# Patient Record
Sex: Female | Born: 1995 | Race: White | Hispanic: No | Marital: Single | Smoking: Never smoker
Health system: Southern US, Community
[De-identification: ages and names within clinical notes are randomized; demographics above are authoritative.]

## PROBLEM LIST (undated history)

## (undated) ENCOUNTER — Inpatient Hospital Stay (HOSPITAL_COMMUNITY): Payer: Self-pay

## (undated) DIAGNOSIS — F909 Attention-deficit hyperactivity disorder, unspecified type: Secondary | ICD-10-CM

## (undated) DIAGNOSIS — O09299 Supervision of pregnancy with other poor reproductive or obstetric history, unspecified trimester: Secondary | ICD-10-CM

## (undated) DIAGNOSIS — K219 Gastro-esophageal reflux disease without esophagitis: Secondary | ICD-10-CM

## (undated) DIAGNOSIS — F913 Oppositional defiant disorder: Secondary | ICD-10-CM

## (undated) DIAGNOSIS — F32A Depression, unspecified: Secondary | ICD-10-CM

## (undated) DIAGNOSIS — F319 Bipolar disorder, unspecified: Secondary | ICD-10-CM

## (undated) DIAGNOSIS — F419 Anxiety disorder, unspecified: Secondary | ICD-10-CM

## (undated) DIAGNOSIS — F329 Major depressive disorder, single episode, unspecified: Secondary | ICD-10-CM

## (undated) HISTORY — DX: Oppositional defiant disorder: F91.3

## (undated) HISTORY — DX: Depression, unspecified: F32.A

## (undated) HISTORY — DX: Gastro-esophageal reflux disease without esophagitis: K21.9

## (undated) HISTORY — DX: Attention-deficit hyperactivity disorder, unspecified type: F90.9

## (undated) HISTORY — PX: TONSILLECTOMY: SUR1361

## (undated) HISTORY — PX: TONSILLECTOMY AND ADENOIDECTOMY: SHX28

## (undated) HISTORY — DX: Major depressive disorder, single episode, unspecified: F32.9

---
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
## 2015-02-01 ENCOUNTER — Other Ambulatory Visit: Payer: Self-pay | Admitting: Nurse Practitioner

## 2015-05-28 ENCOUNTER — Ambulatory Visit: Payer: Medicaid Other | Admitting: Family

## 2015-05-31 ENCOUNTER — Encounter: Payer: Self-pay | Admitting: Physician Assistant

## 2015-11-14 ENCOUNTER — Inpatient Hospital Stay (HOSPITAL_COMMUNITY)
Admission: EM | Admit: 2015-11-14 | Discharge: 2015-11-16 | DRG: 086 | Disposition: A | Discharge status: Home / Self Care | Payer: Medicaid Other | Attending: General Surgery | Admitting: General Surgery

## 2015-11-14 DIAGNOSIS — R402362 Coma scale, best motor response, obeys commands, at arrival to emergency department: Secondary | ICD-10-CM | POA: Diagnosis present

## 2015-11-14 DIAGNOSIS — R402252 Coma scale, best verbal response, oriented, at arrival to emergency department: Secondary | ICD-10-CM | POA: Diagnosis present

## 2015-11-14 DIAGNOSIS — M542 Cervicalgia: Secondary | ICD-10-CM | POA: Diagnosis present

## 2015-11-14 DIAGNOSIS — S2222XA Fracture of body of sternum, initial encounter for closed fracture: Secondary | ICD-10-CM

## 2015-11-14 DIAGNOSIS — S27329A Contusion of lung, unspecified, initial encounter: Secondary | ICD-10-CM

## 2015-11-14 DIAGNOSIS — S02113A Unspecified occipital condyle fracture, initial encounter for closed fracture: Principal | ICD-10-CM | POA: Diagnosis present

## 2015-11-14 DIAGNOSIS — Z23 Encounter for immunization: Secondary | ICD-10-CM

## 2015-11-14 DIAGNOSIS — R079 Chest pain, unspecified: Secondary | ICD-10-CM | POA: Diagnosis present

## 2015-11-14 DIAGNOSIS — R110 Nausea: Secondary | ICD-10-CM | POA: Diagnosis present

## 2015-11-14 DIAGNOSIS — R402142 Coma scale, eyes open, spontaneous, at arrival to emergency department: Secondary | ICD-10-CM | POA: Diagnosis present

## 2015-11-14 DIAGNOSIS — F419 Anxiety disorder, unspecified: Secondary | ICD-10-CM | POA: Diagnosis present

## 2015-11-14 DIAGNOSIS — S2221XA Fracture of manubrium, initial encounter for closed fracture: Secondary | ICD-10-CM | POA: Diagnosis present

## 2015-11-14 HISTORY — DX: Anxiety disorder, unspecified: F41.9

## 2015-11-14 HISTORY — DX: Bipolar disorder, unspecified: F31.9

## 2015-11-14 MED ORDER — SODIUM CHLORIDE 0.9 % IV BOLUS (SEPSIS)
1000.0000 mL | Freq: Once | INTRAVENOUS | Status: AC
  Administered 2015-11-15: 1000 mL via INTRAVENOUS

## 2015-11-14 MED ORDER — TETANUS-DIPHTH-ACELL PERTUSSIS 5-2.5-18.5 LF-MCG/0.5 IM SUSP
0.5000 mL | Freq: Once | INTRAMUSCULAR | Status: AC
  Administered 2015-11-15: 0.5 mL via INTRAMUSCULAR
  Filled 2015-11-14: qty 0.5
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
## 2015-11-15 ENCOUNTER — Emergency Department (HOSPITAL_COMMUNITY): Payer: Medicaid Other

## 2015-11-15 ENCOUNTER — Encounter (HOSPITAL_COMMUNITY): Payer: Self-pay

## 2015-11-15 DIAGNOSIS — R402142 Coma scale, eyes open, spontaneous, at arrival to emergency department: Secondary | ICD-10-CM | POA: Diagnosis present

## 2015-11-15 DIAGNOSIS — S2221XA Fracture of manubrium, initial encounter for closed fracture: Secondary | ICD-10-CM | POA: Diagnosis present

## 2015-11-15 DIAGNOSIS — F419 Anxiety disorder, unspecified: Secondary | ICD-10-CM | POA: Diagnosis present

## 2015-11-15 DIAGNOSIS — R079 Chest pain, unspecified: Secondary | ICD-10-CM | POA: Diagnosis present

## 2015-11-15 DIAGNOSIS — Z23 Encounter for immunization: Secondary | ICD-10-CM | POA: Diagnosis not present

## 2015-11-15 DIAGNOSIS — M542 Cervicalgia: Secondary | ICD-10-CM | POA: Diagnosis present

## 2015-11-15 DIAGNOSIS — S02113A Unspecified occipital condyle fracture, initial encounter for closed fracture: Secondary | ICD-10-CM | POA: Diagnosis present

## 2015-11-15 DIAGNOSIS — R110 Nausea: Secondary | ICD-10-CM | POA: Diagnosis present

## 2015-11-15 DIAGNOSIS — R402362 Coma scale, best motor response, obeys commands, at arrival to emergency department: Secondary | ICD-10-CM | POA: Diagnosis present

## 2015-11-15 DIAGNOSIS — R402252 Coma scale, best verbal response, oriented, at arrival to emergency department: Secondary | ICD-10-CM | POA: Diagnosis present

## 2015-11-15 LAB — CBC
HCT: 41.4 % (ref 36.0–46.0)
HEMOGLOBIN: 14 g/dL (ref 12.0–15.0)
MCH: 31.9 pg (ref 26.0–34.0)
MCHC: 33.8 g/dL (ref 30.0–36.0)
MCV: 94.3 fL (ref 78.0–100.0)
Platelets: 330 10*3/uL (ref 150–400)
RBC: 4.39 MIL/uL (ref 3.87–5.11)
RDW: 12 % (ref 11.5–15.5)
WBC: 12.7 10*3/uL — ABNORMAL HIGH (ref 4.0–10.5)

## 2015-11-15 LAB — PROTIME-INR
INR: 1.01
Prothrombin Time: 13.3 seconds (ref 11.4–15.2)

## 2015-11-15 LAB — COMPREHENSIVE METABOLIC PANEL
ALBUMIN: 4.7 g/dL (ref 3.5–5.0)
ALT: 15 U/L (ref 14–54)
ANION GAP: 8 (ref 5–15)
AST: 21 U/L (ref 15–41)
Alkaline Phosphatase: 72 U/L (ref 38–126)
BUN: 12 mg/dL (ref 6–20)
CO2: 25 mmol/L (ref 22–32)
Calcium: 9.3 mg/dL (ref 8.9–10.3)
Chloride: 107 mmol/L (ref 101–111)
Creatinine, Ser: 0.61 mg/dL (ref 0.44–1.00)
GFR calc non Af Amer: >60 mL/min (ref >60)
GLUCOSE: 110 mg/dL — AB (ref 65–99)
Potassium: 3.7 mmol/L (ref 3.5–5.1)
SODIUM: 140 mmol/L (ref 135–145)
Total Bilirubin: 0.5 mg/dL (ref 0.3–1.2)
Total Protein: 7.3 g/dL (ref 6.5–8.1)

## 2015-11-15 LAB — RAPID URINE DRUG SCREEN, HOSP PERFORMED
Amphetamines: NOT DETECTED
BARBITURATES: NOT DETECTED
BENZODIAZEPINES: POSITIVE — AB
Cocaine: NOT DETECTED
Opiates: NOT DETECTED
Tetrahydrocannabinol: POSITIVE — AB

## 2015-11-15 LAB — URINALYSIS, ROUTINE W REFLEX MICROSCOPIC
Bilirubin Urine: NEGATIVE
GLUCOSE, UA: NEGATIVE mg/dL
Hgb urine dipstick: NEGATIVE
Ketones, ur: NEGATIVE mg/dL
Nitrite: NEGATIVE
PH: 7.5 (ref 5.0–8.0)
PROTEIN: NEGATIVE mg/dL
SPECIFIC GRAVITY, URINE: 1.014 (ref 1.005–1.030)

## 2015-11-15 LAB — I-STAT CHEM 8, ED
BUN: 12 mg/dL (ref 6–20)
CALCIUM ION: 1.18 mmol/L (ref 1.15–1.40)
Chloride: 102 mmol/L (ref 101–111)
Creatinine, Ser: 0.6 mg/dL (ref 0.44–1.00)
GLUCOSE: 105 mg/dL — AB (ref 65–99)
HCT: 41 % (ref 36.0–46.0)
HEMOGLOBIN: 13.9 g/dL (ref 12.0–15.0)
POTASSIUM: 3.6 mmol/L (ref 3.5–5.1)
SODIUM: 141 mmol/L (ref 135–145)
TCO2: 25 mmol/L (ref 0–100)

## 2015-11-15 LAB — POC URINE PREG, ED: PREG TEST UR: NEGATIVE

## 2015-11-15 LAB — URINE MICROSCOPIC-ADD ON

## 2015-11-15 LAB — ETHANOL: Alcohol, Ethyl (B): <5 mg/dL (ref <5)

## 2015-11-15 LAB — I-STAT BETA HCG BLOOD, ED (MC, WL, AP ONLY)

## 2015-11-15 LAB — SAMPLE TO BLOOD BANK

## 2015-11-15 LAB — I-STAT CG4 LACTIC ACID, ED: Lactic Acid, Venous: 1.74 mmol/L (ref 0.5–1.9)

## 2015-11-15 MED ORDER — IOPAMIDOL (ISOVUE-300) INJECTION 61%
INTRAVENOUS | Status: AC
  Administered 2015-11-15: 100 mL
  Filled 2015-11-15: qty 100

## 2015-11-15 MED ORDER — FENTANYL CITRATE (PF) 100 MCG/2ML IJ SOLN
100.0000 ug | INTRAMUSCULAR | Status: DC | PRN
  Administered 2015-11-15: 100 ug via INTRAVENOUS
  Filled 2015-11-15: qty 2

## 2015-11-15 MED ORDER — ONDANSETRON HCL 4 MG PO TABS
4.0000 mg | ORAL_TABLET | Freq: Four times a day (QID) | ORAL | Status: DC | PRN
  Administered 2015-11-16: 4 mg via ORAL
  Filled 2015-11-15: qty 1

## 2015-11-15 MED ORDER — MORPHINE SULFATE (PF) 2 MG/ML IV SOLN
INTRAVENOUS | Status: AC
  Filled 2015-11-15: qty 1

## 2015-11-15 MED ORDER — PROMETHAZINE HCL 25 MG/ML IJ SOLN
12.5000 mg | Freq: Four times a day (QID) | INTRAMUSCULAR | Status: DC | PRN
  Administered 2015-11-15 – 2015-11-16 (×2): 12.5 mg via INTRAVENOUS
  Filled 2015-11-15 (×2): qty 1

## 2015-11-15 MED ORDER — FENTANYL CITRATE (PF) 100 MCG/2ML IJ SOLN
50.0000 ug | Freq: Once | INTRAMUSCULAR | Status: AC
  Administered 2015-11-15: 50 ug via INTRAVENOUS
  Filled 2015-11-15: qty 2

## 2015-11-15 MED ORDER — NAPROXEN 250 MG PO TABS
500.0000 mg | ORAL_TABLET | Freq: Two times a day (BID) | ORAL | Status: DC
Start: 2015-11-15 — End: 2015-11-16
  Administered 2015-11-16 (×2): 500 mg via ORAL
  Filled 2015-11-15 (×2): qty 2

## 2015-11-15 MED ORDER — POTASSIUM CHLORIDE IN NACL 20-0.9 MEQ/L-% IV SOLN
INTRAVENOUS | Status: DC
  Administered 2015-11-15 – 2015-11-16 (×2): via INTRAVENOUS
  Filled 2015-11-15 (×2): qty 1000

## 2015-11-15 MED ORDER — ONDANSETRON HCL 4 MG/2ML IJ SOLN
4.0000 mg | Freq: Once | INTRAMUSCULAR | Status: AC
  Administered 2015-11-15: 4 mg via INTRAVENOUS

## 2015-11-15 MED ORDER — ACETAMINOPHEN 325 MG PO TABS: 650.0000 mg | ORAL_TABLET | ORAL | Status: DC | PRN

## 2015-11-15 MED ORDER — DOCUSATE SODIUM 100 MG PO CAPS
100.0000 mg | ORAL_CAPSULE | Freq: Two times a day (BID) | ORAL | Status: DC
  Administered 2015-11-16: 100 mg via ORAL
  Filled 2015-11-15 (×2): qty 1

## 2015-11-15 MED ORDER — OXYCODONE HCL 5 MG PO TABS: 10.0000 mg | ORAL_TABLET | ORAL | Status: DC | PRN

## 2015-11-15 MED ORDER — OXYCODONE HCL 5 MG PO TABS: 5.0000 mg | ORAL_TABLET | ORAL | Status: DC | PRN

## 2015-11-15 MED ORDER — SODIUM CHLORIDE 0.9 % IV BOLUS (SEPSIS)
1000.0000 mL | Freq: Once | INTRAVENOUS | Status: AC
  Administered 2015-11-15: 1000 mL via INTRAVENOUS

## 2015-11-15 MED ORDER — MORPHINE SULFATE (PF) 2 MG/ML IV SOLN
1.0000 mg | INTRAVENOUS | Status: DC | PRN
  Administered 2015-11-15 (×2): 2 mg via INTRAVENOUS
  Filled 2015-11-15: qty 1

## 2015-11-15 MED ORDER — DIPHENHYDRAMINE HCL 50 MG/ML IJ SOLN: 12.5000 mg | Freq: Three times a day (TID) | INTRAMUSCULAR | Status: DC | PRN

## 2015-11-15 MED ORDER — TRAMADOL HCL 50 MG PO TABS
100.0000 mg | ORAL_TABLET | Freq: Four times a day (QID) | ORAL | Status: DC
  Administered 2015-11-16: 100 mg via ORAL
  Filled 2015-11-15: qty 2

## 2015-11-15 MED ORDER — HYDROCODONE-ACETAMINOPHEN 10-325 MG PO TABS
0.5000 | ORAL_TABLET | ORAL | Status: DC | PRN
  Administered 2015-11-15: 1 via ORAL
  Administered 2015-11-15 – 2015-11-16 (×3): 2 via ORAL
  Administered 2015-11-16 (×3): 1 via ORAL
  Filled 2015-11-15: qty 1
  Filled 2015-11-15: qty 2
  Filled 2015-11-15: qty 1
  Filled 2015-11-15: qty 2
  Filled 2015-11-15: qty 1
  Filled 2015-11-15: qty 2
  Filled 2015-11-15: qty 1

## 2015-11-15 MED ORDER — MORPHINE SULFATE (PF) 2 MG/ML IV SOLN
2.0000 mg | INTRAVENOUS | Status: DC | PRN
  Administered 2015-11-15 (×2): 2 mg via INTRAVENOUS
  Filled 2015-11-15 (×2): qty 1

## 2015-11-15 MED ORDER — POLYETHYLENE GLYCOL 3350 17 G PO PACK
17.0000 g | PACK | Freq: Every day | ORAL | Status: DC
  Administered 2015-11-16: 17 g via ORAL
  Filled 2015-11-15: qty 1

## 2015-11-15 MED ORDER — ONDANSETRON HCL 4 MG/2ML IJ SOLN
4.0000 mg | Freq: Four times a day (QID) | INTRAMUSCULAR | Status: DC | PRN
  Administered 2015-11-15 – 2015-11-16 (×3): 4 mg via INTRAVENOUS
  Filled 2015-11-15 (×3): qty 2

## 2015-11-15 MED ORDER — ONDANSETRON HCL 4 MG/2ML IJ SOLN
INTRAMUSCULAR | Status: AC
  Filled 2015-11-15: qty 2

## 2015-11-15 MED ORDER — ENOXAPARIN SODIUM 40 MG/0.4ML ~~LOC~~ SOLN: 40.0000 mg | SUBCUTANEOUS | Status: DC

## 2015-11-15 MED ORDER — OXYCODONE HCL 5 MG PO TABS
5.0000 mg | ORAL_TABLET | ORAL | Status: DC | PRN
Start: 2015-11-15 — End: 2015-11-15
  Filled 2015-11-15: qty 2

## 2015-11-15 NOTE — ED Notes (Signed)
Patient transported to CT SCAN . 

## 2015-11-15 NOTE — Progress Notes (Signed)
Patient ID: Joyce Baker, female   DOB: 1996/02/07, 20 y.o.   MRN: [id number] Having a lot of posterior skull and anterior chest pain. Hx of some side effects from PO narcotics WIll try scheduled Ultram and see if this lessens need for Norco. [name] [name], MD, MPH, FACS Trauma: [phone] General Surgery: [phone]
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
## 2015-11-15 NOTE — ED Notes (Signed)
Oral foam wet swab applied to pt.'s lips . Pt. Sleeping at this time with no distress , respirations unlabored , IV sites intact , NS  bolus infusing .
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
## 2015-11-15 NOTE — ED Triage Notes (Signed)
See trauma narrator 

## 2015-11-15 NOTE — ED Notes (Signed)
Patient transported to CT scan . 
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
## 2015-11-16 ENCOUNTER — Encounter (HOSPITAL_COMMUNITY): Payer: Self-pay | Admitting: General Practice

## 2015-11-16 LAB — BASIC METABOLIC PANEL
ANION GAP: 8 (ref 5–15)
CALCIUM: 8.7 mg/dL — AB (ref 8.9–10.3)
CO2: 18 mmol/L — AB (ref 22–32)
CREATININE: 0.49 mg/dL (ref 0.44–1.00)
Chloride: 109 mmol/L (ref 101–111)
GFR calc Af Amer: >60 mL/min (ref >60)
GLUCOSE: 114 mg/dL — AB (ref 65–99)
Potassium: 3.8 mmol/L (ref 3.5–5.1)
Sodium: 135 mmol/L (ref 135–145)

## 2015-11-16 LAB — CBC
HCT: 37 % (ref 36.0–46.0)
Hemoglobin: 12.1 g/dL (ref 12.0–15.0)
MCH: 31 pg (ref 26.0–34.0)
MCHC: 32.7 g/dL (ref 30.0–36.0)
MCV: 94.9 fL (ref 78.0–100.0)
PLATELETS: 211 10*3/uL (ref 150–400)
RBC: 3.9 MIL/uL (ref 3.87–5.11)
RDW: 12.1 % (ref 11.5–15.5)
WBC: 8.4 10*3/uL (ref 4.0–10.5)

## 2015-11-16 MED ORDER — PROMETHAZINE HCL 25 MG PO TABS
25.0000 mg | ORAL_TABLET | Freq: Four times a day (QID) | ORAL | Status: DC | PRN
  Administered 2015-11-16 (×2): 25 mg via ORAL
  Filled 2015-11-16 (×2): qty 1

## 2015-11-16 MED ORDER — NAPROXEN 500 MG PO TABS: 500.0000 mg | ORAL_TABLET | Freq: Two times a day (BID) | ORAL | 1 refills | Status: DC

## 2015-11-16 MED ORDER — HYDROCODONE-ACETAMINOPHEN 10-325 MG PO TABS: 1.0000 | ORAL_TABLET | ORAL | 0 refills | Status: DC | PRN

## 2015-11-16 MED ORDER — TRAMADOL HCL 50 MG PO TABS: 100.0000 mg | ORAL_TABLET | Freq: Four times a day (QID) | ORAL | 0 refills | Status: DC

## 2015-11-16 MED ORDER — PROMETHAZINE HCL 25 MG PO TABS: 25.0000 mg | ORAL_TABLET | Freq: Four times a day (QID) | ORAL | 0 refills | Status: DC | PRN

## 2015-11-16 MED ORDER — LORAZEPAM 1 MG PO TABS
1.0000 mg | ORAL_TABLET | Freq: Once | ORAL | Status: AC
  Administered 2015-11-16: 1 mg via ORAL
  Filled 2015-11-16: qty 1
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
## 2015-11-16 NOTE — Progress Notes (Signed)
Orthopedic Tech Progress Note Patient Details:  Joyce Baker [name][birth year] [id number]  Ortho Devices Type of Ortho Device: [place] cervical collar Ortho Device/Splint Location: at bedside Ortho Device/Splint Interventions: Casandra DoffingOrdered   [name] [name] 11/16/2015, 4:59 PM

## 2015-11-16 NOTE — Progress Notes (Signed)
Discharge home. Home discharge instruction given, no question verbalized. 

## 2015-11-16 NOTE — Progress Notes (Signed)
PT Cancellation Note  Patient Details Name: Joyce LeavensBrianna M Baker MRN: [id number] DOB: 06-22-1995   Cancelled Treatment:    Reason Eval/Treat Not Completed: Fatigue/lethargy limiting ability to participate (Pt sleeping, spoke with her mother and when patient awoke she reports she feels confident to negotiate stairs.  Will f/u if patient remains hospitalized.  )   Juniel Groene Artis DelayJ Ulrich Soules 11/16/2015, 4:40 PM Joycelyn RuaAimee Virat Prather, PTA pager [phone]
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
## 2015-11-16 NOTE — Discharge Instructions (Signed)
No driving while taking hydrocodone.  Keep collar on at all times.

## 2015-11-17 ENCOUNTER — Encounter (HOSPITAL_COMMUNITY): Payer: Self-pay | Admitting: Emergency Medicine

## 2015-11-22 ENCOUNTER — Telehealth (HOSPITAL_COMMUNITY): Payer: Self-pay

## 2015-11-22 NOTE — Telephone Encounter (Signed)
Joyce Baker called in for refill of pain meds. Said she's having severe chest pain and head pain that extends from her fracture location. I wrote for both Norco 10/325, #20 and Robaxin 500mg , 2 po q6h prn spasm, #50.

## 2015-11-23 ENCOUNTER — Ambulatory Visit: Payer: Medicaid Other | Admitting: Family Medicine

## 2015-11-24 ENCOUNTER — Encounter: Payer: Self-pay | Admitting: Family

## 2015-11-25 ENCOUNTER — Telehealth (HOSPITAL_COMMUNITY): Payer: Self-pay

## 2015-11-26 ENCOUNTER — Telehealth (HOSPITAL_COMMUNITY): Payer: Self-pay

## 2015-11-26 NOTE — Telephone Encounter (Signed)
Spoke with Dr. Lindie SpruceWyatt who authorized tramadol only at this point. Wrote for #100 and for Zofran 4mg  #30. Unable to speak to pt or mother but admin asst will communicate that if they want to discuss further Dr. Lindie SpruceWyatt would be back on Tuesday.

## 2015-11-26 NOTE — Telephone Encounter (Signed)
Matter sent to Dr. Lindie SpruceWyatt for response.

## 2015-11-26 NOTE — Telephone Encounter (Signed)
NA, unable to leave message

## 2016-02-08 ENCOUNTER — Ambulatory Visit (INDEPENDENT_AMBULATORY_CARE_PROVIDER_SITE_OTHER): Payer: Medicaid Other | Admitting: Family Medicine

## 2016-02-08 VITALS — BP 128/82 | HR 82 | Temp 98.0°F | Ht 62.5 in | Wt 179.0 lb

## 2016-02-08 DIAGNOSIS — F411 Generalized anxiety disorder: Secondary | ICD-10-CM

## 2016-02-08 MED ORDER — DULOXETINE HCL 30 MG PO CPEP: 30.0000 mg | ORAL_CAPSULE | Freq: Every day | ORAL | 0 refills | Status: DC

## 2016-02-08 MED ORDER — ESCITALOPRAM OXALATE 10 MG PO TABS: 10.0000 mg | ORAL_TABLET | Freq: Every day | ORAL | 2 refills | Status: DC

## 2016-02-08 NOTE — Addendum Note (Signed)
Addended by: Mechele ClaudeSTACKS, Marijean Montanye on: 02/08/2016 04:52 PM   Modules accepted: Orders

## 2016-02-08 NOTE — Progress Notes (Addendum)
Subjective:  Patient ID: Joyce Baker, female    DOB: [birth year]  Age: 20 y.o. MRN: [id number]  CC: Anxiety   HPI Joyce Baker presents for A long psychiatric history. There's been a suggestion of bipolar. Of note is previous diagnoses as a child of oppositional defiant unspecified mood disorder and ADHD. Currently she has been off of medication for couple of years. She has noticed her anxiety symptoms increasing. They crescendoed today. She was unable to go to work. She felt like she was shaking all over breaking out in sweats her chest felt tight. She was not short of breath she had no fever chills or sweats. GAD 7 : Generalized Anxiety Score 02/08/2016  Nervous, Anxious, on Edge 3  Control/stop worrying 3  Worry too much - different things 3  Trouble relaxing 3  Restless 3  Easily annoyed or irritable 3  Afraid - awful might happen 3  Total GAD 7 Score 21  Anxiety Difficulty Very difficult      History Joyce Baker has a past medical history of ADHD (attention deficit hyperactivity disorder); Anxiety; Bipolar disorder ([place]); Depression; Gastroesophageal reflux; and Oppositional defiant disorder.   She has a past surgical history that includes Tonsillectomy and adenoidectomy and Tonsillectomy.   Her family history includes ADD / ADHD in her sister; Alcohol abuse in her father; Bipolar disorder in her mother; Cholelithiasis in her maternal grandfather, maternal grandmother, and mother; Depression in her sister; Drug abuse in her mother; Fibromyalgia in her mother; Lupus in her mother.She reports that she has never smoked. She has never used smokeless tobacco. She reports that she drinks alcohol. She reports that she uses drugs, including Marijuana.  Current Outpatient Prescriptions on File Prior to Visit  Medication Sig Dispense Refill  . [DISCONTINUED] amphetamine-dextroamphetamine (ADDERALL XR) 20 MG 24 hr capsule Take 1 capsule (20 mg total) by mouth daily. 30 capsule 0   No  current facility-administered medications on file prior to visit.     ROS Review of Systems  Constitutional: Negative for activity change, appetite change and fever.  HENT: Negative for congestion, rhinorrhea and sore throat.   Eyes: Negative for visual disturbance.  Respiratory: Negative for cough and shortness of breath.   Cardiovascular: Negative for chest pain and palpitations.  Gastrointestinal: Negative for abdominal pain, diarrhea and nausea.  Genitourinary: Negative for dysuria.  Musculoskeletal: Negative for arthralgias and myalgias.    Objective:  BP 128/82   Pulse 82   Temp 98 F (36.7 C) (Oral)   Ht 5' 2.5" (1.588 m)   Wt 179 lb (81.2 kg)   BMI 32.22 kg/m   Physical Exam  Constitutional: She is oriented to person, place, and time. She appears well-developed and well-nourished. She appears distressed.  HENT:  Head: Normocephalic and atraumatic.  Right Ear: External ear normal.  Left Ear: External ear normal.  Nose: Nose normal.  Mouth/Throat: Oropharynx is clear and moist.  Eyes: Conjunctivae and EOM are normal. Pupils are equal, round, and reactive to light.  Neck: Normal range of motion. Neck supple. No thyromegaly present.  Cardiovascular: Normal rate, regular rhythm and normal heart sounds.   No murmur heard. Pulmonary/Chest: Effort normal and breath sounds normal. No respiratory distress. She has no wheezes. She has no rales.  Abdominal: Soft. Bowel sounds are normal. She exhibits no distension. There is no tenderness.  Lymphadenopathy:    She has no cervical adenopathy.  Neurological: She is alert and oriented to person, place, and time. She has normal reflexes.  Skin: Skin is warm and dry.  Psychiatric: Thought content normal. Her mood appears anxious. Her speech is rapid and/or pressured. She is agitated. Cognition and memory are normal. She expresses impulsivity.    Assessment & Plan:   Joyce Baker was seen today for anxiety.  Diagnoses and all orders  for this visit:  Anxiety state -     Ambulatory referral to Psychiatry -     CBC with Differential/Platelet -     CMP14+EGFR -     TSH + free T4  Other orders -     Discontinue: DULoxetine (CYMBALTA) 30 MG capsule; Take 1 capsule (30 mg total) by mouth daily. For one week then two daily. Take with a full stomach at suppertime -     escitalopram (LEXAPRO) 10 MG tablet; Take 1 tablet (10 mg total) by mouth daily.   I have discontinued Joyce Baker naproxen, cyclobenzaprine, HYDROcodone-acetaminophen, NEXIUM, DULoxetine, HYDROcodone-acetaminophen, naproxen, promethazine, and traMADol. I am also having her start on escitalopram.  Meds ordered this encounter  Medications  . DISCONTD: DULoxetine (CYMBALTA) 30 MG capsule    Sig: Take 1 capsule (30 mg total) by mouth daily. For one week then two daily. Take with a full stomach at suppertime    Dispense:  60 capsule    Refill:  0  . escitalopram (LEXAPRO) 10 MG tablet    Sig: Take 1 tablet (10 mg total) by mouth daily.    Dispense:  30 tablet    Refill:  2     Follow-up: Return in about 2 weeks (around 02/22/2016).  Claretta Fraise, M.D.

## 2016-02-09 LAB — CMP14+EGFR
A/G RATIO: 2.1 (ref 1.2–2.2)
ALT: 17 IU/L (ref 0–32)
AST: 14 IU/L (ref 0–40)
Albumin: 4.9 g/dL (ref 3.5–5.5)
Alkaline Phosphatase: 87 IU/L (ref 39–117)
BUN/Creatinine Ratio: 21 (ref 9–23)
BUN: 12 mg/dL (ref 6–20)
Bilirubin Total: 0.2 mg/dL (ref 0.0–1.2)
CALCIUM: 10 mg/dL (ref 8.7–10.2)
CO2: 23 mmol/L (ref 18–29)
CREATININE: 0.58 mg/dL (ref 0.57–1.00)
Chloride: 103 mmol/L (ref 96–106)
GFR, EST AFRICAN AMERICAN: 153 mL/min/{1.73_m2} (ref >59)
GFR, EST NON AFRICAN AMERICAN: 133 mL/min/{1.73_m2} (ref >59)
GLOBULIN, TOTAL: 2.3 g/dL (ref 1.5–4.5)
Glucose: 85 mg/dL (ref 65–99)
Potassium: 4.2 mmol/L (ref 3.5–5.2)
Sodium: 144 mmol/L (ref 134–144)
TOTAL PROTEIN: 7.2 g/dL (ref 6.0–8.5)

## 2016-02-09 LAB — CBC WITH DIFFERENTIAL/PLATELET
BASOS: 0 %
Basophils Absolute: 0 10*3/uL (ref 0.0–0.2)
EOS (ABSOLUTE): 0.2 10*3/uL (ref 0.0–0.4)
EOS: 2 %
HEMATOCRIT: 39.1 % (ref 34.0–46.6)
Hemoglobin: 13.3 g/dL (ref 11.1–15.9)
IMMATURE GRANS (ABS): 0 10*3/uL (ref 0.0–0.1)
IMMATURE GRANULOCYTES: 0 %
LYMPHS: 36 %
Lymphocytes Absolute: 3 10*3/uL (ref 0.7–3.1)
MCH: 31.8 pg (ref 26.6–33.0)
MCHC: 34 g/dL (ref 31.5–35.7)
MCV: 94 fL (ref 79–97)
MONOS ABS: 0.6 10*3/uL (ref 0.1–0.9)
Monocytes: 7 %
NEUTROS PCT: 55 %
Neutrophils Absolute: 4.6 10*3/uL (ref 1.4–7.0)
PLATELETS: 424 10*3/uL — AB (ref 150–379)
RBC: 4.18 x10E6/uL (ref 3.77–5.28)
RDW: 12.9 % (ref 12.3–15.4)
WBC: 8.4 10*3/uL (ref 3.4–10.8)

## 2016-02-09 LAB — TSH+FREE T4
FREE T4: 1.16 ng/dL (ref 0.82–1.77)
TSH: 0.906 u[IU]/mL (ref 0.450–4.500)

## 2016-04-27 ENCOUNTER — Telehealth (HOSPITAL_COMMUNITY): Payer: Self-pay | Admitting: *Deleted

## 2016-04-27 NOTE — Telephone Encounter (Signed)
[facility]. Office called on 03-01-2016 and Joyce ShiverDenise Baker  Name was on voicemail and a voicemail was lm with pt name for her to call office back. Office later called on 04-27-2016 at 10:23am and Joyce Baker picked up. Office staff asked Joyce Baker if they could speak with pt. Per Joyce Blonderenise, pt was not available and staff asked her to please have pt call office and and office number was provider. Joyce Baker verbalized understanding and agreed.

## 2016-08-09 ENCOUNTER — Other Ambulatory Visit: Payer: Self-pay

## 2016-08-09 ENCOUNTER — Encounter (HOSPITAL_COMMUNITY): Payer: Self-pay | Admitting: Nurse Practitioner

## 2016-08-09 ENCOUNTER — Emergency Department (HOSPITAL_COMMUNITY)
Admission: EM | Admit: 2016-08-09 | Discharge: 2016-08-09 | Disposition: A | Discharge status: Home / Self Care | Payer: Medicaid Other | Attending: Emergency Medicine | Admitting: Emergency Medicine

## 2016-08-09 DIAGNOSIS — Z79899 Other long term (current) drug therapy: Secondary | ICD-10-CM | POA: Insufficient documentation

## 2016-08-09 DIAGNOSIS — F902 Attention-deficit hyperactivity disorder, combined type: Secondary | ICD-10-CM | POA: Insufficient documentation

## 2016-08-09 DIAGNOSIS — F410 Panic disorder [episodic paroxysmal anxiety] without agoraphobia: Secondary | ICD-10-CM

## 2016-08-09 LAB — BASIC METABOLIC PANEL
Anion gap: 8 (ref 5–15)
BUN: 9 mg/dL (ref 6–20)
CALCIUM: 9.3 mg/dL (ref 8.9–10.3)
CHLORIDE: 104 mmol/L (ref 101–111)
CO2: 23 mmol/L (ref 22–32)
CREATININE: 0.55 mg/dL (ref 0.44–1.00)
GFR calc Af Amer: >60 mL/min (ref >60)
GFR calc non Af Amer: >60 mL/min (ref >60)
Glucose, Bld: 101 mg/dL — ABNORMAL HIGH (ref 65–99)
Potassium: 3.5 mmol/L (ref 3.5–5.1)
SODIUM: 135 mmol/L (ref 135–145)

## 2016-08-09 LAB — URINALYSIS, ROUTINE W REFLEX MICROSCOPIC
BILIRUBIN URINE: NEGATIVE
GLUCOSE, UA: NEGATIVE mg/dL
HGB URINE DIPSTICK: NEGATIVE
KETONES UR: 5 mg/dL — AB
Nitrite: NEGATIVE
PH: 5 (ref 5.0–8.0)
PROTEIN: NEGATIVE mg/dL
Specific Gravity, Urine: 1.026 (ref 1.005–1.030)

## 2016-08-09 LAB — ETHANOL

## 2016-08-09 LAB — ACETAMINOPHEN LEVEL

## 2016-08-09 LAB — RAPID URINE DRUG SCREEN, HOSP PERFORMED
Amphetamines: NOT DETECTED
BARBITURATES: NOT DETECTED
BENZODIAZEPINES: POSITIVE — AB
Cocaine: NOT DETECTED
Opiates: NOT DETECTED
Tetrahydrocannabinol: POSITIVE — AB

## 2016-08-09 LAB — POC URINE PREG, ED: PREG TEST UR: NEGATIVE

## 2016-08-09 LAB — SALICYLATE LEVEL

## 2016-08-09 MED ORDER — HYDROXYZINE HCL 25 MG PO TABS
25.0000 mg | ORAL_TABLET | Freq: Once | ORAL | Status: AC
  Administered 2016-08-09: 25 mg via ORAL
  Filled 2016-08-09: qty 1

## 2016-08-09 MED ORDER — HYDROXYZINE HCL 25 MG PO TABS: 25.0000 mg | ORAL_TABLET | Freq: Every evening | ORAL | 0 refills | Status: DC | PRN

## 2016-08-09 NOTE — BH Assessment (Signed)
Tele Assessment Note   Joyce LeavensBrianna M Baker is an 21 y.o. female who [facility].  Pt denies SI/HI and does not endorse AVH.  Pt expressed symptoms of anxiety and depression. Pt is in denial of substance abuse; however, tested positive for Benzodiazapine's and marijuana. Pt reports not having a doctor or psychiatrist.  Pt sts she lives with her parents and can return home. Pt sts her symptoms of anxiety is interfering with her ability to work and stay focused on the job. Pt sts she was sent home due to her anxiety being so bad.  Pt denies having legal issues and any upcoming court dates.  Pt presented in a hospital gown.  Pt was cooperative, had good eye contact and coherent speech. Pt had freedom of movement.  Pt was alert and cooperative. Pt mood was pleasant and anxious.  Pt affect was congruent with her mood. Pt judgement appears unimpaired and her insight fair. Pt was 4x's orienetd.  Pt does not meet criteria for inpatient treatment services.  Pt is being recommended for discharge with outpatient sources for SA treatment.  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 ADHD (attention deficit hyperactivity disorder)   . Anxiety   . Bipolar disorder (HCC)   . Depression   . Gastroesophageal reflux   . Oppositional defiant disorder     Past Surgical History:  Procedure Laterality Date  . TONSILLECTOMY    . TONSILLECTOMY AND ADENOIDECTOMY      Family History:  Family History  Problem Relation Age of Onset  . Bipolar disorder Mother   . Drug abuse Mother   . Lupus Mother   . Fibromyalgia Mother   . Cholelithiasis Mother   . Alcohol abuse Father   . ADD / ADHD Sister   . Depression Sister   . Thyroid disease Neg Hx   . Celiac disease Neg Hx   . Ulcers Neg Hx   . Cholelithiasis Maternal Grandmother   . Cholelithiasis Maternal Grandfather      Social History:  reports that she has never smoked. She has never used smokeless tobacco. She reports that she drinks alcohol. She reports that she uses drugs, including Marijuana.  Additional Social History:  Alcohol / Drug Use Pain Medications: See MAR Prescriptions: See MAR Over the Counter: See MAR History of alcohol / drug use?: No history of alcohol / drug abuse  CIWA: CIWA-Ar BP: (!) 137/101 Pulse Rate: 97 COWS:    PATIENT STRENGTHS: (choose at least two) Average or above average intelligence Communication skills Supportive family/friends  Allergies:  Allergies  Allergen Reactions  . Amoxicillin Rash    Home Medications:  (Not in a hospital admission)  OB/GYN Status:  No LMP recorded.  General Assessment Data [facility] Assessment: In system Is this a Tele or Face-to-Face Assessment?: Tele Assessment Is this an Initial Assessment or a Re-assessment for this encounter?: Initial Assessment Marital status: Single Living Arrangements: Parent Can pt return to current living arrangement?: Yes Admission Status: Voluntary Is patient capable of signing voluntary admission?: Yes Referral Source: Other Insurance type: Medicaid     Crisis Care Plan Living Arrangements: Parent Legal Guardian: Other: (Self) Name of Psychiatrist: Not currently Name of Therapist: None reported  Education Status Is patient currently in school?: No Highest grade of school patient has completed: 12  Risk to self with the past  6 months Suicidal Ideation: No Has patient been a risk to self within the past 6 months prior to admission? : No Suicidal Intent: No Has patient had any suicidal intent within the past 6 months prior to admission? : No Is patient at risk for suicide?: No Suicidal Plan?: No Has patient had any suicidal plan within the past 6 months prior to admission? : No Access to Means: No What has been your use of drugs/alcohol within the last 12  months?: NA Previous Attempts/Gestures: No How many times?: 0 Other Self Harm Risks: None reported Intentional Self Injurious Behavior: None Family Suicide History: No Recent stressful life event(s): Other (Comment) (Pt sts she is not sure) Persecutory voices/beliefs?: No Depression: Yes Depression Symptoms: Tearfulness, Isolating, Fatigue, Loss of interest in usual pleasures, Feeling worthless/self pity Substance abuse history and/or treatment for substance abuse?: No Suicide prevention information given to non-admitted patients: Not applicable  Risk to Others within the past 6 months Homicidal Ideation: No Does patient have any lifetime risk of violence toward others beyond the six months prior to admission? : No Thoughts of Harm to Others: No Current Homicidal Intent: No Current Homicidal Plan: No Access to Homicidal Means: No Identified Victim: None reported History of harm to others?: No Assessment of Violence: None Noted Violent Behavior Description: None reported Does patient have access to weapons?: No Criminal Charges Pending?: No Does patient have a court date: No Is patient on probation?: No  Psychosis Hallucinations: None noted Delusions: None noted  Mental Status Report Appearance/Hygiene: In hospital gown Eye Contact: Fair Motor Activity: Freedom of movement Speech: Unremarkable Level of Consciousness: Crying Mood: Depressed, Sad, Anxious Affect: Sad, Euphoric, Anxious Anxiety Level: Severe Thought Processes: Coherent Judgement: Partial Obsessive Compulsive Thoughts/Behaviors: None  Cognitive Functioning Concentration: Normal Memory: Recent Intact, Remote Intact IQ: Average Insight: Good Impulse Control: Good Appetite: Good Weight Gain: 0 Sleep: No Change Total Hours of Sleep: 6 Vegetative Symptoms: None  ADLScreening [facility] Assessment Services) Patient's cognitive ability adequate to safely complete daily activities?: Yes Patient able to  express need for assistance with ADLs?: Yes Independently performs ADLs?: Yes (appropriate for developmental age)  Prior Inpatient Therapy Prior Inpatient Therapy: Yes Prior Therapy Facilty/Provider(s): none reported Reason for Treatment: na  Prior Outpatient Therapy Prior Outpatient Therapy: No Prior Therapy Dates: none reported Prior Therapy Facilty/Provider(s): no Reason for Treatment: na Does patient have an ACCT team?: No Does patient have Intensive In-House Services?  : No Does patient have [facility] services? : Yes Does patient have P4CC services?: No  ADL Screening (condition at time of admission) Patient's cognitive ability adequate to safely complete daily activities?: Yes Patient able to express need for assistance with ADLs?: Yes Independently performs ADLs?: Yes (appropriate for developmental age)       Abuse/Neglect Assessment (Assessment to be complete while patient is alone) Physical Abuse: Denies Verbal Abuse: Denies Sexual Abuse: Denies Exploitation of patient/patient's resources: Denies Self-Neglect: Denies Values / Beliefs Cultural Requests During Hospitalization: None Spiritual Requests During Hospitalization: None Consults Spiritual Care Consult Needed: No Advance Directives (For Healthcare) Does Patient Have a Medical Advance Directive?: No    Additional Information 1:1 In Past 12 Months?: No CIRT Risk: No Elopement Risk: No Does patient have medical clearance?: Yes     Disposition:  Disposition Initial Assessment Completed for this Encounter: Yes Disposition of Patient: Outpatient treatment (Per Morton Stall PA) Type of outpatient treatment: Adult  Gillermina Hu 08/09/2016 9:38 PM

## 2016-08-09 NOTE — ED Triage Notes (Addendum)
Pt presents with c/o anxiety. She reports her anxiety has been increased over the past several months. she's had trouble concentrating on her normal activities and sleeping at night because her mind is constantly racing. She was at work today and felt so anxious that she had to leave . She has been on anxiety medications IN THE past but took herself off of them. She denies thoughts of harming herself or others.

## 2016-08-09 NOTE — ED Notes (Signed)
Pt stable, understands discharge instructions, and reasons for return.   
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
## 2016-08-09 NOTE — Discharge Instructions (Signed)
The case manager will call you tomorrow to help you get an appointment.

## 2016-08-09 NOTE — ED Notes (Signed)
Pt's loved one name Jannett approached this EMT to inquire about pts vital signs. Jannett was told that I would have to get permission from pt in order to disclose her medical information with her. Pt stated she was fine with sharing her vital sign information with Jannett.

## 2016-09-21 ENCOUNTER — Ambulatory Visit (INDEPENDENT_AMBULATORY_CARE_PROVIDER_SITE_OTHER): Payer: Self-pay | Admitting: Physician Assistant

## 2016-09-21 ENCOUNTER — Encounter: Payer: Self-pay | Admitting: Physician Assistant

## 2016-09-21 VITALS — BP 103/65 | HR 106 | Temp 98.5°F | Ht 62.5 in | Wt 176.0 lb

## 2016-09-21 DIAGNOSIS — F419 Anxiety disorder, unspecified: Secondary | ICD-10-CM

## 2016-09-21 DIAGNOSIS — K219 Gastro-esophageal reflux disease without esophagitis: Secondary | ICD-10-CM

## 2016-09-21 DIAGNOSIS — R000 Tachycardia, unspecified: Secondary | ICD-10-CM

## 2016-09-21 MED ORDER — OMEPRAZOLE 20 MG PO CPDR: 20.0000 mg | DELAYED_RELEASE_CAPSULE | Freq: Every day | ORAL | 11 refills | Status: DC

## 2016-09-21 MED ORDER — PAROXETINE HCL 10 MG PO TABS: 10.0000 mg | ORAL_TABLET | Freq: Every day | ORAL | 0 refills | Status: DC

## 2016-09-21 MED ORDER — PROMETHAZINE HCL 25 MG PO TABS: 25.0000 mg | ORAL_TABLET | Freq: Three times a day (TID) | ORAL | 0 refills | Status: DC | PRN

## 2016-09-21 MED ORDER — METOPROLOL SUCCINATE ER 25 MG PO TB24: 25.0000 mg | ORAL_TABLET | Freq: Every day | ORAL | 0 refills | Status: DC

## 2016-09-21 NOTE — Progress Notes (Signed)
BP 103/65   Pulse (!) 106   Temp 98.5 F (36.9 C) (Oral)   Ht 5' 2.5" (1.588 m)   Wt 176 lb (79.8 kg)   BMI 31.68 kg/m    Subjective:    Patient ID: Joyce Baker, female    DOB: 08-21-1995, 21 y.o.   MRN: [id number]  HPI: Joyce Baker is a 21 y.o. female presenting on 09/21/2016 for Anxiety Micah Flesher to ER a month ago. )  Patient has had a long-standing issues with depression and anxiety. She was in middle school when she first was treated. She has been some years without treatment. At this time the primary symptom is related to anxiety. She will also have a rapid heart rate. She went to the emergency room and was cleared on the cardiac side but continues with a feeling in her chest when the anxiety comes on. It happens almost every day. Her pH Q is positive for depression also. We've had a long discussion about how the depletion of serotonin in the brain can affect people in a variety of ways. She understands and is willing to take medication. 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 I've encouraged her to stay on omeprazole daily. Depression screen [facility] 2/9 09/21/2016 02/08/2016  Decreased Interest 3 0  Down, Depressed, Hopeless 3 2  PHQ - 2 Score 6 2  Altered sleeping 3 2  Tired, decreased energy 3 1  Change in appetite 3 2  Feeling bad or failure about yourself  3 1  Trouble concentrating 2 3  Moving slowly or fidgety/restless 3 0  Suicidal thoughts 1 -  PHQ-9 Score 24 11  Difficult doing work/chores - Somewhat difficult   Though she has thought of suicide she commits to safety and will call someone if she feels any suicidal thoughts.  Relevant past medical, surgical, family and social history reviewed and updated as indicated. Allergies and medications reviewed and updated.  Past Medical History:  Diagnosis Date  . ADHD (attention deficit hyperactivity disorder)   . Anxiety   . Bipolar disorder (HCC)   . Depression     . Gastroesophageal reflux   . Oppositional defiant disorder     Past Surgical History:  Procedure Laterality Date  . TONSILLECTOMY    . TONSILLECTOMY AND ADENOIDECTOMY      Review of Systems  Constitutional: Negative for activity change, fatigue and fever.  HENT: Negative.   Eyes: Negative.   Respiratory: Negative.  Negative for cough and shortness of breath.   Cardiovascular: Positive for chest pain and palpitations.  Gastrointestinal: Negative.  Negative for abdominal pain.  Endocrine: Negative.   Genitourinary: Negative.  Negative for dysuria.  Musculoskeletal: Negative.   Skin: Negative.   Neurological: Positive for weakness.  Psychiatric/Behavioral: Positive for agitation, decreased concentration, dysphoric mood and suicidal ideas. Negative for self-injury and sleep disturbance. The patient is nervous/anxious.     Allergies as of 09/21/2016      Reactions   Amoxicillin Rash      Medication List       Accurate as of 09/21/16 12:37 PM. Always use your most recent med list.          metoprolol succinate 25 MG 24 hr tablet Commonly known as:  TOPROL-XL Take 1 tablet (25 mg total) by mouth daily.   omeprazole 20 MG capsule Commonly known as:  PRILOSEC Take 1 capsule (20 mg total) by mouth daily.   PARoxetine 10 MG  tablet Commonly known as:  PAXIL Take 1 tablet (10 mg total) by mouth daily.   promethazine 25 MG tablet Commonly known as:  PHENERGAN Take 1 tablet (25 mg total) by mouth every 8 (eight) hours as needed for nausea or vomiting.          Objective:    BP 103/65   Pulse (!) 106   Temp 98.5 F (36.9 C) (Oral)   Ht 5' 2.5" (1.588 m)   Wt 176 lb (79.8 kg)   BMI 31.68 kg/m   Allergies  Allergen Reactions  . Amoxicillin Rash    Physical Exam  Constitutional: She is oriented to person, place, and time. She appears well-developed and well-nourished.  HENT:  Head: Normocephalic and atraumatic.  Eyes: Pupils are equal, round, and reactive to  light. Conjunctivae and EOM are normal.  Cardiovascular: Regular rhythm, normal heart sounds and intact distal pulses.   No extrasystoles are present. Tachycardia present.   No murmur heard. Pulmonary/Chest: Effort normal and breath sounds normal.  Abdominal: Soft. Bowel sounds are normal.  Neurological: She is alert and oriented to person, place, and time. She has normal reflexes.  Skin: Skin is warm and dry. No rash noted.  Psychiatric: She has a normal mood and affect. Her behavior is normal. Judgment and thought content normal.    Results for orders placed or performed during the hospital encounter of 08/09/16  Urinalysis, Routine w reflex microscopic  Result Value Ref Range   Color, Urine YELLOW YELLOW   APPearance CLOUDY (A) CLEAR   Specific Gravity, Urine 1.026 1.005 - 1.030   pH 5.0 5.0 - 8.0   Glucose, UA NEGATIVE NEGATIVE mg/dL   Hgb urine dipstick NEGATIVE NEGATIVE   Bilirubin Urine NEGATIVE NEGATIVE   Ketones, ur 5 (A) NEGATIVE mg/dL   Protein, ur NEGATIVE NEGATIVE mg/dL   Nitrite NEGATIVE NEGATIVE   Leukocytes, UA MODERATE (A) NEGATIVE   RBC / HPF 0-5 0 - 5 RBC/hpf   WBC, UA 6-30 0 - 5 WBC/hpf   Bacteria, UA MANY (A) NONE SEEN   Squamous Epithelial / LPF TOO NUMEROUS TO COUNT (A) NONE SEEN   Mucous PRESENT   Rapid urine drug screen (hospital performed)  Result Value Ref Range   Opiates NONE DETECTED NONE DETECTED   Cocaine NONE DETECTED NONE DETECTED   Benzodiazepines POSITIVE (A) NONE DETECTED   Amphetamines NONE DETECTED NONE DETECTED   Tetrahydrocannabinol POSITIVE (A) NONE DETECTED   Barbiturates NONE DETECTED NONE DETECTED  Ethanol  Result Value Ref Range   Alcohol, Ethyl (B) <5 <5 mg/dL  Salicylate level  Result Value Ref Range   Salicylate Lvl <7.0 2.8 - 30.0 mg/dL  Acetaminophen level  Result Value Ref Range   Acetaminophen (Tylenol), Serum <10 (L) 10 - 30 ug/mL  Basic metabolic panel  Result Value Ref Range   Sodium 135 135 - 145 mmol/L    Potassium 3.5 3.5 - 5.1 mmol/L   Chloride 104 101 - 111 mmol/L   CO2 23 22 - 32 mmol/L   Glucose, Bld 101 (H) 65 - 99 mg/dL   BUN 9 6 - 20 mg/dL   Creatinine, Ser 7.250.55 0.44 - 1.00 mg/dL   Calcium 9.3 8.9 - 36.610.3 mg/dL   GFR calc non Af Amer >60 >60 mL/min   GFR calc Af Amer >60 >60 mL/min   Anion gap 8 5 - 15  POC urine preg, ED ([facility])  Result Value Ref Range   Preg Test, Ur NEGATIVE  NEGATIVE      Assessment & Plan:   1. Anxiety - PARoxetine (PAXIL) 10 MG tablet; Take 1 tablet (10 mg total) by mouth daily.  Dispense: 30 tablet; Refill: 0  2. Tachycardia - metoprolol succinate (TOPROL-XL) 25 MG 24 hr tablet; Take 1 tablet (25 mg total) by mouth daily.  Dispense: 30 tablet; Refill: 0   Scheduled Meds: Continuous Infusions: PRN Meds:.  Continue all other maintenance medications as listed above.  Follow up plan: Return in about 4 weeks (around 10/19/2016) for recheck.  Educational handout given for anxiety  Remus Loffler PA-C [facility] Medicine [address]  [place], [place] [address] [phone]   09/21/2016, 12:37 PM

## 2016-09-21 NOTE — Patient Instructions (Signed)

## 2016-10-19 ENCOUNTER — Ambulatory Visit (INDEPENDENT_AMBULATORY_CARE_PROVIDER_SITE_OTHER): Payer: Self-pay | Admitting: Physician Assistant

## 2016-10-19 ENCOUNTER — Encounter: Payer: Self-pay | Admitting: Physician Assistant

## 2016-10-19 VITALS — BP 113/74 | HR 90 | Temp 99.5°F | Ht 62.5 in | Wt 177.6 lb

## 2016-10-19 DIAGNOSIS — F419 Anxiety disorder, unspecified: Secondary | ICD-10-CM

## 2016-10-19 DIAGNOSIS — F39 Unspecified mood [affective] disorder: Secondary | ICD-10-CM

## 2016-10-19 MED ORDER — RISPERIDONE 0.25 MG PO TABS: 0.2500 mg | ORAL_TABLET | Freq: Every day | ORAL | 0 refills | Status: DC

## 2016-10-19 MED ORDER — PAROXETINE HCL 10 MG PO TABS: 10.0000 mg | ORAL_TABLET | Freq: Every day | ORAL | 0 refills | Status: DC

## 2016-10-19 MED ORDER — ALPRAZOLAM 0.25 MG PO TABS: 0.5000 mg | ORAL_TABLET | Freq: Every evening | ORAL | 0 refills | Status: DC | PRN

## 2016-10-19 NOTE — Patient Instructions (Signed)
In a few days you may receive a survey in the mail or online from [employer] regarding your visit with us today. Please take a moment to fill this out. Your feedback is very important to our whole office. It can help us better understand your needs as well as improve your experience and satisfaction. Thank you for taking your time to complete it. We care about you.  Anushka Hartinger, PA-C  

## 2016-10-23 NOTE — Progress Notes (Signed)
BP 113/74   Pulse 90   Temp 99.5 F (37.5 C) (Oral)   Ht 5' 2.5" (1.588 m)   Wt 177 lb 9.6 oz (80.6 kg)   BMI 31.97 kg/m    Subjective:    Patient ID: Joyce Baker, female    DOB: 25-Mar-1995, 21 y.o.   MRN: [id number]  HPI: SHEKIA KUPER is a 21 y.o. female presenting on 10/19/2016 for Follow-up (4 week follow up on Anxiety)  This patient comes in for periodic recheck on medications and conditions including depression and anxiety.  MOOD DISORDER QUESTIONNAIRE  Has there ever been a period of time when you were not your usual self and ... - you felt so good or so hyper that other people thought you were not your normal self or you were so hyper that you got into trouble?  Yes  - you were so irritable that you shouted at people or started fights or arguments? Yes  - you felt much more self-confident than usual? No  - you got much less sleep than usual and found that you didn't really miss it? No  - you were more talkative or spoke much faster than usual? Yes  - thoughts raced through your head or you couldn't slow your mind down? Yes  - you were so easily distracted by things around you that you had trouble concentrating or stay on track? Yes  -you had much more energy than usual? No  - you were much more active or did many more things than usual? No  - you were much more social or outgoing than usual, for example, you telephoned friends in the middle of the night? Yes  -you were much more interested in sex than usual? No  -you did things that were unusual for you or that other people might have thought were excessive, foolish or risky? Yes  -spending money got you or your family in trouble? No     If you checked yes for more than one of the above, have several of these ever happened during the same period of time? Yes  How much of a problem did any of these cause you- like being able to work; having family, money or legal troubles; getting in arguments or  fights? moderate  Have any of your blood relatives had manic-depressive illness or bipolar disorder?  Yes   Has a health professional ever told you that you have manic-depressive illness or bipolar disorder? Yes  Depression screen [facility] 2/9 10/19/2016 09/21/2016 02/08/2016  Decreased Interest 1 3 0  Down, Depressed, Hopeless 2 3 2   PHQ - 2 Score 3 6 2   Altered sleeping 3 3 2   Tired, decreased energy 2 3 1   Change in appetite 2 3 2   Feeling bad or failure about yourself  2 3 1   Trouble concentrating 3 2 3   Moving slowly or fidgety/restless 2 3 0  Suicidal thoughts 0 1 -  PHQ-9 Score 17 24 11   Difficult doing work/chores - - Somewhat difficult   .   All medications are reviewed today. There are no reports of any problems with the medications. All of the medical conditions are reviewed and updated.  Lab work is reviewed and will be ordered as medically necessary. There are no new problems reported with today's visit.   Relevant past medical, surgical, family and social history reviewed and updated as indicated. Allergies and medications reviewed and updated.  Past Medical History:  Diagnosis Date  . ADHD (  attention deficit hyperactivity disorder)   . Anxiety   . Bipolar disorder (HCC)   . Depression   . Gastroesophageal reflux   . Oppositional defiant disorder     Past Surgical History:  Procedure Laterality Date  . TONSILLECTOMY    . TONSILLECTOMY AND ADENOIDECTOMY      Review of Systems  Constitutional: Negative.   HENT: Negative.   Eyes: Negative.   Respiratory: Negative.   Gastrointestinal: Negative.   Genitourinary: Negative.   Psychiatric/Behavioral: Positive for decreased concentration and dysphoric mood. The patient is nervous/anxious.     Allergies as of 10/19/2016      Reactions   Amoxicillin Rash      Medication List       Accurate as of 10/19/16 11:59 PM. Always use your most recent med list.          ALPRAZolam 0.25 MG tablet Commonly known as:   XANAX Take 2 tablets (0.5 mg total) by mouth at bedtime as needed for anxiety.   metoprolol succinate 25 MG 24 hr tablet Commonly known as:  TOPROL-XL Take 1 tablet (25 mg total) by mouth daily.   omeprazole 20 MG capsule Commonly known as:  PRILOSEC Take 1 capsule (20 mg total) by mouth daily.   PARoxetine 10 MG tablet Commonly known as:  PAXIL Take 1 tablet (10 mg total) by mouth daily.   promethazine 25 MG tablet Commonly known as:  PHENERGAN Take 1 tablet (25 mg total) by mouth every 8 (eight) hours as needed for nausea or vomiting.   risperiDONE 0.25 MG tablet Commonly known as:  RISPERDAL Take 1-4 tablets (0.25-1 mg total) by mouth at bedtime.          Objective:    BP 113/74   Pulse 90   Temp 99.5 F (37.5 C) (Oral)   Ht 5' 2.5" (1.588 m)   Wt 177 lb 9.6 oz (80.6 kg)   BMI 31.97 kg/m   Allergies  Allergen Reactions  . Amoxicillin Rash    Physical Exam  Constitutional: She is oriented to person, place, and time. She appears well-developed and well-nourished.  HENT:  Head: Normocephalic and atraumatic.  Eyes: Pupils are equal, round, and reactive to light. Conjunctivae and EOM are normal.  Cardiovascular: Normal rate, regular rhythm, normal heart sounds and intact distal pulses.   Pulmonary/Chest: Effort normal and breath sounds normal.  Abdominal: Soft. Bowel sounds are normal.  Neurological: She is alert and oriented to person, place, and time. She has normal reflexes.  Skin: Skin is warm and dry. No rash noted.  Psychiatric: Her behavior is normal. Judgment and thought content normal. Her mood appears anxious. Her speech is not rapid and/or pressured. She is not aggressive.        Assessment & Plan:   1. Anxiety - PARoxetine (PAXIL) 10 MG tablet; Take 1 tablet (10 mg total) by mouth daily.  Dispense: 30 tablet; Refill: 0 - ALPRAZolam (XANAX) 0.25 MG tablet; Take 2 tablets (0.5 mg total) by mouth at bedtime as needed for anxiety.  Dispense: 30  tablet; Refill: 0  2. Mood disorder (HCC) - risperiDONE (RISPERDAL) 0.25 MG tablet; Take 1-4 tablets (0.25-1 mg total) by mouth at bedtime.  Dispense: 120 tablet; Refill: 0    Current Outpatient Prescriptions:  .  metoprolol succinate (TOPROL-XL) 25 MG 24 hr tablet, Take 1 tablet (25 mg total) by mouth daily., Disp: 30 tablet, Rfl: 0 .  PARoxetine (PAXIL) 10 MG tablet, Take 1 tablet (10 mg total) by  mouth daily., Disp: 30 tablet, Rfl: 0 .  ALPRAZolam (XANAX) 0.25 MG tablet, Take 2 tablets (0.5 mg total) by mouth at bedtime as needed for anxiety., Disp: 30 tablet, Rfl: 0 .  omeprazole (PRILOSEC) 20 MG capsule, Take 1 capsule (20 mg total) by mouth daily. (Patient not taking: Reported on 10/19/2016), Disp: 30 capsule, Rfl: 11 .  promethazine (PHENERGAN) 25 MG tablet, Take 1 tablet (25 mg total) by mouth every 8 (eight) hours as needed for nausea or vomiting. (Patient not taking: Reported on 10/19/2016), Disp: 30 tablet, Rfl: 0 .  risperiDONE (RISPERDAL) 0.25 MG tablet, Take 1-4 tablets (0.25-1 mg total) by mouth at bedtime., Disp: 120 tablet, Rfl: 0 Continue all other maintenance medications as listed above.  Follow up plan: Return in about 4 weeks (around 11/16/2016) for recheck.  Educational handout given for survey  Remus Loffler PA-C [facility] [address]  [place], [place] [address] [phone]   10/23/2016, 8:37 AM

## 2016-11-07 ENCOUNTER — Other Ambulatory Visit: Payer: Self-pay | Admitting: Physician Assistant

## 2016-11-07 DIAGNOSIS — F39 Unspecified mood [affective] disorder: Secondary | ICD-10-CM

## 2016-11-07 DIAGNOSIS — F419 Anxiety disorder, unspecified: Secondary | ICD-10-CM

## 2016-11-09 ENCOUNTER — Encounter: Payer: Self-pay | Admitting: Nurse Practitioner

## 2016-11-09 ENCOUNTER — Ambulatory Visit (INDEPENDENT_AMBULATORY_CARE_PROVIDER_SITE_OTHER): Payer: Medicaid Other | Admitting: Nurse Practitioner

## 2016-11-09 ENCOUNTER — Other Ambulatory Visit: Payer: Self-pay | Admitting: Physician Assistant

## 2016-11-09 VITALS — BP 103/65 | HR 76 | Temp 97.7°F | Ht 62.5 in | Wt 185.0 lb

## 2016-11-09 DIAGNOSIS — Z3009 Encounter for other general counseling and advice on contraception: Secondary | ICD-10-CM

## 2016-11-09 DIAGNOSIS — F419 Anxiety disorder, unspecified: Secondary | ICD-10-CM

## 2016-11-09 DIAGNOSIS — F39 Unspecified mood [affective] disorder: Secondary | ICD-10-CM

## 2016-11-09 DIAGNOSIS — N926 Irregular menstruation, unspecified: Secondary | ICD-10-CM

## 2016-11-09 LAB — PREGNANCY, URINE: PREG TEST UR: NEGATIVE

## 2016-11-09 NOTE — Progress Notes (Signed)
   Subjective:    Patient ID: Joyce LeavensBrianna M Baker, female    DOB: 04-Sep-1995, 21 y.o.   MRN: [id number]  HPI Patient come sin today concerned that she may be pregnant. Her last menustral period is 4 days late- she is on birth control and denies missing any pills.she has not had any nausea or vomiting or abdominal pain.  * She saw Prudy FeelerAngel Jones on 10/19/16 and was started on respiradone as a mood stabilizer. She has been taking it 2 x a day she says the pharmacy did not give her enough. According to chart she was rx 120 tablets.she says she is out of pills.    Review of Systems  Constitutional: Negative.   HENT: Negative.   Respiratory: Negative.   Cardiovascular: Negative.   Gastrointestinal: Negative.   Genitourinary: Negative.   Musculoskeletal: Negative.   Neurological: Negative.   Psychiatric/Behavioral: Negative.   All other systems reviewed and are negative.      Objective:   Physical Exam  Constitutional: She is oriented to person, place, and time. She appears well-developed and well-nourished. No distress.  Cardiovascular: Normal rate and regular rhythm.   Pulmonary/Chest: Effort normal and breath sounds normal.  Neurological: She is alert and oriented to person, place, and time.  Skin: Skin is warm.  Psychiatric: She has a normal mood and affect. Her behavior is normal. Judgment and thought content normal.   BP 103/65   Pulse 76   Temp 97.7 F (36.5 C) (Oral)   Ht 5' 2.5" (1.588 m)   Wt 185 lb (83.9 kg)   LMP 10/05/2016   BMI 33.30 kg/m        Assessment & Plan:  1. Late menses Pregnancy test negative Continue birth control pills as rx and see if will stabilize itself - Pregnancy, urine  2. Mood disorder (HCC) Will have to talk with pharmacy about medication- was rx 120 respiradone pills and should not be out Keep follow up appointmnet with angel jones ,PA  Mary-Margaret Daphine DeutscherMartin, FNP

## 2016-11-16 ENCOUNTER — Ambulatory Visit (INDEPENDENT_AMBULATORY_CARE_PROVIDER_SITE_OTHER): Payer: Self-pay | Admitting: Physician Assistant

## 2016-11-16 ENCOUNTER — Encounter: Payer: Self-pay | Admitting: Physician Assistant

## 2016-11-16 DIAGNOSIS — F39 Unspecified mood [affective] disorder: Secondary | ICD-10-CM

## 2016-11-16 DIAGNOSIS — F419 Anxiety disorder, unspecified: Secondary | ICD-10-CM

## 2016-11-16 MED ORDER — ALPRAZOLAM 0.5 MG PO TABS: 0.5000 mg | ORAL_TABLET | Freq: Every evening | ORAL | 2 refills | Status: DC | PRN

## 2016-11-16 MED ORDER — RISPERIDONE 1 MG PO TABS: 1.0000 mg | ORAL_TABLET | Freq: Every day | ORAL | 2 refills | Status: DC

## 2016-11-16 MED ORDER — PAROXETINE HCL 10 MG PO TABS: 10.0000 mg | ORAL_TABLET | Freq: Every day | ORAL | 5 refills | Status: DC

## 2016-11-16 NOTE — Patient Instructions (Signed)
In a few days you may receive a survey in the mail or online from [employer] regarding your visit with us today. Please take a moment to fill this out. Your feedback is very important to our whole office. It can help us better understand your needs as well as improve your experience and satisfaction. Thank you for taking your time to complete it. We care about you.  Beryl Balz, PA-C  

## 2016-11-16 NOTE — Progress Notes (Signed)
BP 118/80   Pulse 81   Temp 98.5 F (36.9 C) (Oral)   Ht 5' 2.5" (1.588 m)   Wt 182 lb (82.6 kg)   BMI 32.76 kg/m    Subjective:    Patient ID: Joyce Baker, female    DOB: [birth year], 21 y.o.   MRN: [id number]  HPI: Joyce Baker is a 21 y.o. female presenting on 11/16/2016 for Follow-up (medication recheck )  This patient comes in for periodic recheck on medications and conditions including Mood disorder and anxiety. She is doing much better with the medications. She is currently taking Paxil 10 mg, risperidone 1 mg. She has used the Xanax at bedtime occasionally. We are going to start to wean that away and increase the risperidone to a higher dose. I can tell a very large difference in her visit today. Her pH Q is much improved. Depression screen [facility] 2/9 11/16/2016 10/19/2016 09/21/2016 02/08/2016  Decreased Interest 0  Down, Depressed, Hopeless PHQ - 2 Score Altered sleeping Tired, decreased energy Change in appetite Feeling bad or failure about yourself  Trouble concentrating Moving slowly or fidgety/restless 0 2 3 0  Suicidal thoughts 0 0 1 -  PHQ-9 Score Difficult doing work/chores - - - Somewhat difficult   .   All medications are reviewed today. There are no reports of any problems with the medications. All of the medical conditions are reviewed and updated.  Lab work is reviewed and will be ordered as medically necessary. There are no new problems reported with today's visit.   Relevant past medical, surgical, family and social history reviewed and updated as indicated. Allergies and medications reviewed and updated.  Past Medical History:  Diagnosis Date  . ADHD (attention deficit hyperactivity disorder)   . Anxiety   . Bipolar disorder (HCC)   . Depression   . Gastroesophageal reflux   . Oppositional defiant disorder     Past Surgical History:  Procedure Laterality Date  .  TONSILLECTOMY    . TONSILLECTOMY AND ADENOIDECTOMY      Review of Systems  Constitutional: Negative.  Negative for activity change, fatigue and fever.  HENT: Negative.   Eyes: Negative.   Respiratory: Negative.  Negative for cough.   Cardiovascular: Negative.  Negative for chest pain.  Gastrointestinal: Negative.  Negative for abdominal pain.  Endocrine: Negative.   Genitourinary: Negative.  Negative for dysuria.  Musculoskeletal: Negative.   Skin: Negative.   Neurological: Negative.     Allergies as of 11/16/2016      Reactions   Amoxicillin Rash      Medication List       Accurate as of 11/16/16 11:59 PM. Always use your most recent med list.          ALPRAZolam 0.5 MG tablet Commonly known as:  XANAX Take 1 tablet (0.5 mg total) by mouth at bedtime as needed for anxiety.   omeprazole 20 MG capsule Commonly known as:  PRILOSEC Take 1 capsule (20 mg total) by mouth daily.   PARoxetine 10 MG tablet Commonly known as:  PAXIL Take 1 tablet (10 mg total) by mouth daily.   promethazine 25 MG tablet Commonly known as:  PHENERGAN Take 1 tablet (25 mg total) by mouth every  8 (eight) hours as needed for nausea or vomiting.   risperiDONE 1 MG tablet Commonly known as:  RISPERDAL Take 1-2 tablets (1-2 mg total) by mouth at bedtime.            Discharge Care Instructions        Start     Ordered   11/16/16 0000  risperiDONE (RISPERDAL) 1 MG tablet  Daily at bedtime    Question:  Supervising Provider  Answer:  Elenora Gamma   11/16/16 1627   11/16/16 0000  PARoxetine (PAXIL) 10 MG tablet  Daily    Question:  Supervising Provider  Answer:  Elenora Gamma   11/16/16 1627   11/16/16 0000  ALPRAZolam (XANAX) 0.5 MG tablet  At bedtime PRN    Question:  Supervising Provider  Answer:  Elenora Gamma   11/16/16 1627         Objective:    BP 118/80   Pulse 81   Temp 98.5 F (36.9 C) (Oral)   Ht 5' 2.5" (1.588 m)   Wt 182 lb (82.6 kg)   BMI 32.76  kg/m   Allergies  Allergen Reactions  . Amoxicillin Rash    Physical Exam  Constitutional: She is oriented to person, place, and time. She appears well-developed and well-nourished.  HENT:  Head: Normocephalic and atraumatic.  Eyes: Pupils are equal, round, and reactive to light. Conjunctivae and EOM are normal.  Cardiovascular: Normal rate, regular rhythm, normal heart sounds and intact distal pulses.   Pulmonary/Chest: Effort normal and breath sounds normal.  Abdominal: Soft. Bowel sounds are normal.  Neurological: She is alert and oriented to person, place, and time. She has normal reflexes.  Skin: Skin is warm and dry. No rash noted.  Psychiatric: She has a normal mood and affect. Her behavior is normal. Judgment and thought content normal.    Results for orders placed or performed in visit on 11/09/16  Pregnancy, urine  Result Value Ref Range   Preg Test, Ur Negative Negative      Assessment & Plan:   1. Mood disorder (HCC) - risperiDONE (RISPERDAL) 1 MG tablet; Take 1-2 tablets (1-2 mg total) by mouth at bedtime.  Dispense: 60 tablet; Refill: 2  2. Anxiety - PARoxetine (PAXIL) 10 MG tablet; Take 1 tablet (10 mg total) by mouth daily.  Dispense: 30 tablet; Refill: 5 - ALPRAZolam (XANAX) 0.5 MG tablet; Take 1 tablet (0.5 mg total) by mouth at bedtime as needed for anxiety.  Dispense: 30 tablet; Refill: 2    Current Outpatient Prescriptions:  .  ALPRAZolam (XANAX) 0.5 MG tablet, Take 1 tablet (0.5 mg total) by mouth at bedtime as needed for anxiety., Disp: 30 tablet, Rfl: 2 .  PARoxetine (PAXIL) 10 MG tablet, Take 1 tablet (10 mg total) by mouth daily., Disp: 30 tablet, Rfl: 5 .  risperiDONE (RISPERDAL) 1 MG tablet, Take 1-2 tablets (1-2 mg total) by mouth at bedtime., Disp: 60 tablet, Rfl: 2 .  omeprazole (PRILOSEC) 20 MG capsule, Take 1 capsule (20 mg total) by mouth daily. (Patient not taking: Reported on 10/19/2016), Disp: 30 capsule, Rfl: 11 .  promethazine  (PHENERGAN) 25 MG tablet, Take 1 tablet (25 mg total) by mouth every 8 (eight) hours as needed for nausea or vomiting. (Patient not taking: Reported on 10/19/2016), Disp: 30 tablet, Rfl: 0 Continue all other maintenance medications as listed above.  Follow up plan: Return in about 3 months (around 02/15/2017) for recheck.  Educational handout given for survey  Remus LofflerAngel S. Kalyb Pemble PA-C [facility] [address]  [place], [place] [address] [phone]   11/17/2016, 9:08 AM

## 2017-02-15 ENCOUNTER — Ambulatory Visit: Payer: Medicaid Other | Admitting: Family

## 2017-02-17 ENCOUNTER — Encounter: Payer: Self-pay | Admitting: Family

## 2017-02-17 ENCOUNTER — Other Ambulatory Visit: Payer: Self-pay | Admitting: Physician Assistant

## 2017-02-17 DIAGNOSIS — F419 Anxiety disorder, unspecified: Secondary | ICD-10-CM

## 2017-03-02 ENCOUNTER — Telehealth: Payer: Self-pay | Admitting: Physician Assistant

## 2017-03-02 DIAGNOSIS — F419 Anxiety disorder, unspecified: Secondary | ICD-10-CM

## 2017-03-02 MED ORDER — ALPRAZOLAM 0.5 MG PO TABS: 0.5000 mg | ORAL_TABLET | Freq: Every evening | ORAL | 0 refills | Status: DC | PRN

## 2017-03-02 NOTE — Telephone Encounter (Signed)
Patient last seen 11/16/2016 and has med refill apt scheduled for 03/13/2017. Requesting refill of her xanax (filled 9/13 #30 R2) and Prilosec (filled 7/19 #30 R11). Attempted to contact patient but no answer- patient should have plenty of refills left of Prilosec. Please advise Xanax.

## 2017-03-13 ENCOUNTER — Encounter: Payer: Self-pay | Admitting: *Deleted

## 2017-03-13 ENCOUNTER — Ambulatory Visit (INDEPENDENT_AMBULATORY_CARE_PROVIDER_SITE_OTHER): Payer: Self-pay | Admitting: Physician Assistant

## 2017-03-13 ENCOUNTER — Encounter: Payer: Self-pay | Admitting: Physician Assistant

## 2017-03-13 VITALS — BP 107/73 | HR 96 | Temp 97.7°F | Ht 62.5 in | Wt 185.6 lb

## 2017-03-13 DIAGNOSIS — F419 Anxiety disorder, unspecified: Secondary | ICD-10-CM

## 2017-03-13 DIAGNOSIS — F339 Major depressive disorder, recurrent, unspecified: Secondary | ICD-10-CM | POA: Insufficient documentation

## 2017-03-13 DIAGNOSIS — F39 Unspecified mood [affective] disorder: Secondary | ICD-10-CM

## 2017-03-13 MED ORDER — ALPRAZOLAM 0.5 MG PO TABS: 0.5000 mg | ORAL_TABLET | Freq: Every evening | ORAL | 5 refills | Status: DC | PRN

## 2017-03-13 MED ORDER — PAROXETINE HCL 10 MG PO TABS: 10.0000 mg | ORAL_TABLET | Freq: Every day | ORAL | 5 refills | Status: DC

## 2017-03-13 NOTE — Patient Instructions (Addendum)
In a few days you may receive a survey in the mail or online from [employer] regarding your visit with us today. Please take a moment to fill this out. Your feedback is very important to our whole office. It can help us better understand your needs as well as improve your experience and satisfaction. Thank you for taking your time to complete it. We care about you.  Prudy FeelerAngel Jamarques Pinedo, PA-C   [place]

## 2017-03-13 NOTE — Progress Notes (Signed)
BP 107/73   Pulse 96   Temp 97.7 F (36.5 C) (Oral)   Ht 5' 2.5" (1.588 m)   Wt 185 lb 9.6 oz (84.2 kg)   BMI 33.41 kg/m    Subjective:    Patient ID: Joyce Baker, female    DOB: 1995/03/23, 22 y.o.   MRN: [id number]  HPI: Joyce Baker is a 22 y.o. female presenting on 03/13/2017 for Follow-up and Medication Refill  Patient comes in for recheck on her medications.  Overall she feels that her mood is being quite stable.  She is feeling much less depressed at this time.  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  She is not having any difficulty with her medications.  She sometimes still has some mood swings.  She has stopped taking the risperidone.  We have discussed that this can help with her anxiety and depression and also reduce some of her mood swings.  She tolerated it well in the past.  Relevant past medical, surgical, family and social history reviewed and updated as indicated. Allergies and medications reviewed and updated.  Past Medical History:  Diagnosis Date  . ADHD (attention deficit hyperactivity disorder)   . Anxiety   . Bipolar disorder (HCC)   . Depression   . Gastroesophageal reflux   . Oppositional defiant disorder     Past Surgical History:  Procedure Laterality Date  . TONSILLECTOMY    . TONSILLECTOMY AND ADENOIDECTOMY      Review of Systems  Constitutional: Negative.  Negative for activity change, fatigue and fever.  HENT: Negative.   Eyes: Negative.   Respiratory: Negative.  Negative for cough.   Cardiovascular: Negative.  Negative for chest pain.  Gastrointestinal: Negative.  Negative for abdominal pain.  Endocrine: Negative.   Genitourinary: Negative.  Negative for dysuria.  Musculoskeletal: Negative.   Skin: Negative.   Neurological: Negative.   Psychiatric/Behavioral: Positive for decreased concentration and dysphoric mood. The patient is nervous/anxious.     Allergies as of 03/13/2017      Reactions   Amoxicillin Rash        Medication List        Accurate as of 03/13/17  4:17 PM. Always use your most recent med list.          ALPRAZolam 0.5 MG tablet Commonly known as:  XANAX Take 1 tablet (0.5 mg total) by mouth at bedtime as needed for anxiety.   omeprazole 20 MG capsule Commonly known as:  PRILOSEC Take 1 capsule (20 mg total) by mouth daily.   PARoxetine 10 MG tablet Commonly known as:  PAXIL Take 1 tablet (10 mg total) by mouth daily.   promethazine 25 MG tablet Commonly known as:  PHENERGAN Take 1 tablet (25 mg total) by mouth every 8 (eight) hours as needed for nausea or vomiting.   risperiDONE 1 MG tablet Commonly known as:  RISPERDAL Take 1-2 tablets (1-2 mg total) by mouth at bedtime.          Objective:    BP 107/73   Pulse 96   Temp 97.7 F (36.5 C) (Oral)   Ht 5' 2.5" (1.588 m)   Wt 185 lb 9.6 oz (84.2 kg)   BMI 33.41 kg/m   Allergies  Allergen Reactions  . Amoxicillin Rash    Physical Exam  Constitutional: She is oriented to person, place, and time. She appears well-developed and well-nourished.  HENT:  Head: Normocephalic and atraumatic.  Eyes: Conjunctivae and EOM are  normal. Pupils are equal, round, and reactive to light.  Cardiovascular: Normal rate, regular rhythm, normal heart sounds and intact distal pulses.  Pulmonary/Chest: Effort normal and breath sounds normal.  Abdominal: Soft. Bowel sounds are normal.  Neurological: She is alert and oriented to person, place, and time. She has normal reflexes.  Skin: Skin is warm and dry. No rash noted.  Psychiatric: She has a normal mood and affect. Her behavior is normal. Judgment and thought content normal.        Assessment & Plan:   1. Anxiety - PARoxetine (PAXIL) 10 MG tablet; Take 1 tablet (10 mg total) by mouth daily.  Dispense: 30 tablet; Refill: 5 - ALPRAZolam (XANAX) 0.5 MG tablet; Take 1 tablet (0.5 mg total) by mouth at bedtime as needed for anxiety.  Dispense: 30 tablet; Refill: 5  2.  Depression, recurrent (HCC)  3. Mood disorder (HCC)    Current Outpatient Medications:  .  ALPRAZolam (XANAX) 0.5 MG tablet, Take 1 tablet (0.5 mg total) by mouth at bedtime as needed for anxiety., Disp: 30 tablet, Rfl: 5 .  omeprazole (PRILOSEC) 20 MG capsule, Take 1 capsule (20 mg total) by mouth daily., Disp: 30 capsule, Rfl: 11 .  PARoxetine (PAXIL) 10 MG tablet, Take 1 tablet (10 mg total) by mouth daily., Disp: 30 tablet, Rfl: 5 .  promethazine (PHENERGAN) 25 MG tablet, Take 1 tablet (25 mg total) by mouth every 8 (eight) hours as needed for nausea or vomiting., Disp: 30 tablet, Rfl: 0 .  risperiDONE (RISPERDAL) 1 MG tablet, Take 1-2 tablets (1-2 mg total) by mouth at bedtime., Disp: 60 tablet, Rfl: 2 Continue all other maintenance medications as listed above.  Follow up plan: Return in about 6 months (around 09/10/2017) for recheck.  Educational handout given for survey  Remus LofflerAngel S. Tearsa Kowalewski PA-C [facility] [address]  [place], [place] [address] [phone]   03/13/2017, 4:17 PM

## 2017-03-21 ENCOUNTER — Ambulatory Visit (INDEPENDENT_AMBULATORY_CARE_PROVIDER_SITE_OTHER): Payer: Self-pay | Admitting: Family

## 2017-03-21 ENCOUNTER — Encounter: Payer: Self-pay | Admitting: Family

## 2017-03-21 VITALS — BP 106/73 | HR 108 | Temp 97.9°F | Ht 62.5 in | Wt 186.8 lb

## 2017-03-21 DIAGNOSIS — J209 Acute bronchitis, unspecified: Secondary | ICD-10-CM

## 2017-03-21 MED ORDER — PREDNISONE 10 MG (21) PO TBPK: ORAL_TABLET | ORAL | 0 refills | Status: DC

## 2017-03-21 NOTE — Progress Notes (Signed)
   Subjective:    Patient ID: Joyce LeavensBrianna M Steinruck, female    DOB: 12-29-1995, 22 y.o.   MRN: [id number]  Cough  This is a new problem. The current episode started 1 to 4 weeks ago. The problem has been gradually worsening. The problem occurs every few minutes. The cough is non-productive. Associated symptoms include chills, headaches, rhinorrhea and wheezing. Pertinent negatives include no ear congestion, ear pain, fever, myalgias or nasal congestion. Risk factors: vape. She has tried rest and OTC cough suppressant for the symptoms. The treatment provided mild relief.      Review of Systems  Constitutional: Positive for chills. Negative for fever.  HENT: Positive for rhinorrhea. Negative for ear pain.   Respiratory: Positive for cough and wheezing.   Musculoskeletal: Negative for myalgias.  Neurological: Positive for headaches.  All other systems reviewed and are negative.      Objective:   Physical Exam  Constitutional: She is oriented to person, place, and time. She appears well-developed and well-nourished. No distress.  HENT:  Head: Normocephalic and atraumatic.  Right Ear: External ear normal.  Left Ear: External ear normal.  Nose: Mucosal edema and rhinorrhea present.  Mouth/Throat: Posterior oropharyngeal erythema present.  Eyes: Pupils are equal, round, and reactive to light.  Neck: Normal range of motion. Neck supple. No thyromegaly present.  Cardiovascular: Normal rate, regular rhythm, normal heart sounds and intact distal pulses.  No murmur heard. Pulmonary/Chest: Effort normal and breath sounds normal. No respiratory distress. She has no wheezes.  Abdominal: Soft. Bowel sounds are normal. She exhibits no distension. There is no tenderness.  Musculoskeletal: Normal range of motion. She exhibits no edema or tenderness.  Neurological: She is alert and oriented to person, place, and time.  Skin: Skin is warm and dry.  Psychiatric: She has a normal mood and affect. Her  behavior is normal. Judgment and thought content normal.  Vitals reviewed.    BP 106/73 (BP Location: Left Arm, Patient Position: Sitting, Cuff Size: Normal)   Pulse (!) 108   Temp 97.9 F (36.6 C) (Oral)   Ht 5' 2.5" (1.588 m)   Wt 186 lb 12.8 oz (84.7 kg)   BMI 33.62 kg/m      Assessment & Plan:  1. Acute bronchitis, unspecified organism Rest Force fluids Mucinex  OTC cough syrup RTO prn  - predniSONE (STERAPRED UNI-PAK 21 TAB) 10 MG (21) TBPK tablet; Use as directed  Dispense: 21 tablet; Refill: 0    Jannifer Rodneyhristy Shadeed Colberg, FNP

## 2017-03-21 NOTE — Patient Instructions (Signed)

## 2017-04-10 ENCOUNTER — Other Ambulatory Visit: Payer: Self-pay | Admitting: Physician Assistant

## 2017-04-20 ENCOUNTER — Emergency Department (HOSPITAL_BASED_OUTPATIENT_CLINIC_OR_DEPARTMENT_OTHER): Payer: Medicaid Other

## 2017-04-20 ENCOUNTER — Emergency Department (HOSPITAL_BASED_OUTPATIENT_CLINIC_OR_DEPARTMENT_OTHER)
Admission: EM | Admit: 2017-04-20 | Discharge: 2017-04-20 | Disposition: A | Discharge status: Home / Self Care | Payer: Medicaid Other | Attending: Emergency Medicine | Admitting: Emergency Medicine

## 2017-04-20 ENCOUNTER — Other Ambulatory Visit: Payer: Self-pay

## 2017-04-20 ENCOUNTER — Encounter (HOSPITAL_BASED_OUTPATIENT_CLINIC_OR_DEPARTMENT_OTHER): Payer: Self-pay | Admitting: *Deleted

## 2017-04-20 DIAGNOSIS — R102 Pelvic and perineal pain: Secondary | ICD-10-CM | POA: Diagnosis not present

## 2017-04-20 DIAGNOSIS — N760 Acute vaginitis: Secondary | ICD-10-CM | POA: Insufficient documentation

## 2017-04-20 DIAGNOSIS — Z79899 Other long term (current) drug therapy: Secondary | ICD-10-CM | POA: Insufficient documentation

## 2017-04-20 DIAGNOSIS — N390 Urinary tract infection, site not specified: Secondary | ICD-10-CM | POA: Insufficient documentation

## 2017-04-20 DIAGNOSIS — B9689 Other specified bacterial agents as the cause of diseases classified elsewhere: Secondary | ICD-10-CM

## 2017-04-20 DIAGNOSIS — R1030 Lower abdominal pain, unspecified: Secondary | ICD-10-CM | POA: Diagnosis present

## 2017-04-20 DIAGNOSIS — F902 Attention-deficit hyperactivity disorder, combined type: Secondary | ICD-10-CM | POA: Insufficient documentation

## 2017-04-20 LAB — CBC WITH DIFFERENTIAL/PLATELET
BASOS ABS: 0 10*3/uL (ref 0.0–0.1)
Basophils Relative: 0 %
EOS ABS: 0.1 10*3/uL (ref 0.0–0.7)
Eosinophils Relative: 2 %
HCT: 36.8 % (ref 36.0–46.0)
HEMOGLOBIN: 12.8 g/dL (ref 12.0–15.0)
Lymphocytes Relative: 32 %
Lymphs Abs: 2.7 10*3/uL (ref 0.7–4.0)
MCH: 32.3 pg (ref 26.0–34.0)
MCHC: 34.8 g/dL (ref 30.0–36.0)
MCV: 92.9 fL (ref 78.0–100.0)
MONO ABS: 0.5 10*3/uL (ref 0.1–1.0)
Monocytes Relative: 7 %
NEUTROS ABS: 4.8 10*3/uL (ref 1.7–7.7)
NEUTROS PCT: 59 %
Platelets: 382 10*3/uL (ref 150–400)
RBC: 3.96 MIL/uL (ref 3.87–5.11)
RDW: 11.6 % (ref 11.5–15.5)
WBC: 8.2 10*3/uL (ref 4.0–10.5)

## 2017-04-20 LAB — URINALYSIS, ROUTINE W REFLEX MICROSCOPIC
BILIRUBIN URINE: NEGATIVE
GLUCOSE, UA: NEGATIVE mg/dL
HGB URINE DIPSTICK: NEGATIVE
Ketones, ur: 15 mg/dL — AB
Nitrite: NEGATIVE
PH: 6.5 (ref 5.0–8.0)
Protein, ur: NEGATIVE mg/dL
Specific Gravity, Urine: >1.03 — ABNORMAL HIGH (ref 1.005–1.030)

## 2017-04-20 LAB — URINALYSIS, MICROSCOPIC (REFLEX)

## 2017-04-20 LAB — WET PREP, GENITAL
Sperm: NONE SEEN
TRICH WET PREP: NONE SEEN
YEAST WET PREP: NONE SEEN

## 2017-04-20 LAB — BASIC METABOLIC PANEL
ANION GAP: 9 (ref 5–15)
BUN: 11 mg/dL (ref 6–20)
CALCIUM: 9.1 mg/dL (ref 8.9–10.3)
CO2: 22 mmol/L (ref 22–32)
Chloride: 105 mmol/L (ref 101–111)
Creatinine, Ser: 0.54 mg/dL (ref 0.44–1.00)
Glucose, Bld: 98 mg/dL (ref 65–99)
Potassium: 3.7 mmol/L (ref 3.5–5.1)
Sodium: 136 mmol/L (ref 135–145)

## 2017-04-20 LAB — HCG, QUANTITATIVE, PREGNANCY: HCG, BETA CHAIN, QUANT, S: 82 m[IU]/mL — AB (ref <5)

## 2017-04-20 LAB — PREGNANCY, URINE: Preg Test, Ur: POSITIVE — AB

## 2017-04-20 MED ORDER — NITROFURANTOIN MONOHYD MACRO 100 MG PO CAPS: 100.0000 mg | ORAL_CAPSULE | Freq: Two times a day (BID) | ORAL | 0 refills | Status: AC

## 2017-04-20 MED ORDER — METRONIDAZOLE 500 MG PO TABS: 500.0000 mg | ORAL_TABLET | Freq: Two times a day (BID) | ORAL | 0 refills | Status: DC

## 2017-04-20 MED ORDER — ACETAMINOPHEN 325 MG PO TABS
650.0000 mg | ORAL_TABLET | Freq: Once | ORAL | Status: AC
  Administered 2017-04-20: 650 mg via ORAL
  Filled 2017-04-20: qty 2

## 2017-04-20 NOTE — ED Notes (Signed)
Pt and mother angry that pt is still waiting to be seen-pt concerned her HR was elevated- she admits she has hx of same and was on meds in the past-pt VS rechecked-initial HR was in the 90-pt began speaking loudly HR jumped to 120s-explained to pt and mother multiple times the nature of how pts are seen in the ED after their repeated c/o that other pt came in after her and were taken to tx area ahead of her-pt NAD-steady fast paced gait

## 2017-04-20 NOTE — ED Notes (Signed)
Went to update pt to delay, as she rang call bell and c/o pain, mom was lying in bed with patient and they were both giggling.  Pt c/o that she has not eaten anything, mom c/o wait time, both informed that pt's results just came back and that the PA would be in shortly, but that there were several emergent patients that came into the department and it may take a little longer.

## 2017-04-20 NOTE — ED Notes (Signed)
Pt called in waiting room x3. No answer. 

## 2017-04-20 NOTE — ED Notes (Signed)
Pt c/o of generalized abdominal cramps for two weeks, no bleeding and no discharge.  This is pt's first pregnancy.

## 2017-04-20 NOTE — Discharge Instructions (Addendum)
Please read attached information regarding your condition. Return to the [facility] listed below in 48 hours for repeat testing of your quantitative hCG. Take Flagyl and Macrobid as directed. You can take vitamin B6 and doxylamine for your nausea.  These medications combined is called Diclegis. Return to ED for worsening abdominal pain, vaginal bleeding, increased vomiting.

## 2017-04-20 NOTE — ED Notes (Signed)
Pt updated again to delay, she is sitting in bed laughing and eating doritos and drinking soda

## 2017-04-20 NOTE — ED Notes (Signed)
Pt verbalizes understanding of d/c instructions and denies any further needs at this time. 

## 2017-04-20 NOTE — ED Triage Notes (Signed)
Pt reports generalized abd pain x 1.5 weeks with nausea, Voices concern she may be pregnant. States she also gets "hot flashes" and her heart rate goes up. States she was having a hot flash prior to triage
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
## 2017-04-22 LAB — RPR: RPR: NONREACTIVE

## 2017-04-22 LAB — HIV ANTIBODY (ROUTINE TESTING W REFLEX): HIV SCREEN 4TH GENERATION: NONREACTIVE

## 2017-04-23 LAB — GC/CHLAMYDIA PROBE AMP (~~LOC~~) NOT AT ARMC
CHLAMYDIA, DNA PROBE: POSITIVE — AB
Neisseria Gonorrhea: NEGATIVE

## 2017-04-24 ENCOUNTER — Telehealth: Payer: Self-pay | Admitting: Student

## 2017-04-24 DIAGNOSIS — A749 Chlamydial infection, unspecified: Secondary | ICD-10-CM

## 2017-04-24 MED ORDER — AZITHROMYCIN 500 MG PO TABS: 1000.0000 mg | ORAL_TABLET | Freq: Once | ORAL | 0 refills | Status: AC

## 2017-04-24 NOTE — Telephone Encounter (Addendum)
Eual FinesBrianna M Melching tested positive for  Chlamydia. Patient was called by RN and allergies and pharmacy confirmed. Rx sent to pharmacy of choice.   Judeth HornLawrence, Palin Tristan, NP 04/24/2017 2:28 PM       ----- Message from Kathe BectonLori S Berdik, RN sent at 04/24/2017  1:59 PM EST ----- This patient tested positive for :  chlamydia  She ,"is allergic to Amoxicillin" I have informed the patient of her results and confirmed her pharmacy is correct in her chart. Please send Rx.   Thank you,   Kathe BectonBerdik, Lori S, RN   [facility]

## 2017-05-20 ENCOUNTER — Other Ambulatory Visit: Payer: Self-pay

## 2017-05-20 ENCOUNTER — Encounter (HOSPITAL_BASED_OUTPATIENT_CLINIC_OR_DEPARTMENT_OTHER): Payer: Self-pay | Admitting: *Deleted

## 2017-05-20 ENCOUNTER — Emergency Department (HOSPITAL_BASED_OUTPATIENT_CLINIC_OR_DEPARTMENT_OTHER)
Admission: EM | Admit: 2017-05-20 | Discharge: 2017-05-20 | Disposition: A | Discharge status: Home / Self Care | Payer: Medicaid Other | Attending: Emergency Medicine | Admitting: Emergency Medicine

## 2017-05-20 DIAGNOSIS — R0602 Shortness of breath: Secondary | ICD-10-CM | POA: Diagnosis not present

## 2017-05-20 DIAGNOSIS — Z5321 Procedure and treatment not carried out due to patient leaving prior to being seen by health care provider: Secondary | ICD-10-CM | POA: Diagnosis not present

## 2017-05-20 DIAGNOSIS — Z3A01 Less than 8 weeks gestation of pregnancy: Secondary | ICD-10-CM | POA: Insufficient documentation

## 2017-05-20 DIAGNOSIS — O99511 Diseases of the respiratory system complicating pregnancy, first trimester: Secondary | ICD-10-CM | POA: Diagnosis not present

## 2017-05-20 NOTE — ED Notes (Signed)
Pt informed registration she was leaving  

## 2017-05-20 NOTE — ED Triage Notes (Signed)
Pt reports SOB since her last visit to ED in Feb when she found out she was pregnant. States she stopped taking her alprazolam when she found out she was pregnant. RT assessed pt in triage, BBS clear. Pt speaking in full sentences

## 2017-12-04 ENCOUNTER — Ambulatory Visit: Payer: Medicaid Other | Admitting: *Deleted

## 2017-12-04 DIAGNOSIS — Z0130 Encounter for examination of blood pressure without abnormal findings: Secondary | ICD-10-CM

## 2017-12-04 NOTE — Progress Notes (Signed)
Pt here for BP check Pt is [redacted] weeks pregnant and was sent to ED yesterday due to elevated BP Pt checked BP at [facility] and got elevated reading BP here was 153 98 P 86 Pt instructed to contact OB Pt verbalizes understanding
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
## 2018-09-28 ENCOUNTER — Other Ambulatory Visit: Payer: Self-pay

## 2018-09-28 ENCOUNTER — Emergency Department (HOSPITAL_COMMUNITY)
Admission: EM | Admit: 2018-09-28 | Discharge: 2018-09-28 | Disposition: A | Discharge status: Home / Self Care | Payer: Medicaid Other | Attending: Emergency Medicine | Admitting: Emergency Medicine

## 2018-09-28 ENCOUNTER — Encounter (HOSPITAL_COMMUNITY): Payer: Self-pay | Admitting: Emergency Medicine

## 2018-09-28 DIAGNOSIS — N390 Urinary tract infection, site not specified: Secondary | ICD-10-CM | POA: Insufficient documentation

## 2018-09-28 DIAGNOSIS — B9689 Other specified bacterial agents as the cause of diseases classified elsewhere: Secondary | ICD-10-CM | POA: Insufficient documentation

## 2018-09-28 DIAGNOSIS — Z79899 Other long term (current) drug therapy: Secondary | ICD-10-CM | POA: Insufficient documentation

## 2018-09-28 DIAGNOSIS — N898 Other specified noninflammatory disorders of vagina: Secondary | ICD-10-CM | POA: Insufficient documentation

## 2018-09-28 DIAGNOSIS — R1031 Right lower quadrant pain: Secondary | ICD-10-CM | POA: Insufficient documentation

## 2018-09-28 DIAGNOSIS — N760 Acute vaginitis: Secondary | ICD-10-CM | POA: Insufficient documentation

## 2018-09-28 DIAGNOSIS — N739 Female pelvic inflammatory disease, unspecified: Secondary | ICD-10-CM | POA: Insufficient documentation

## 2018-09-28 LAB — PREGNANCY, URINE: Preg Test, Ur: NEGATIVE

## 2018-09-28 LAB — URINALYSIS, ROUTINE W REFLEX MICROSCOPIC
Bilirubin Urine: NEGATIVE
Glucose, UA: NEGATIVE mg/dL
Hgb urine dipstick: NEGATIVE
Ketones, ur: NEGATIVE mg/dL
Nitrite: POSITIVE — AB
Protein, ur: 30 mg/dL — AB
Specific Gravity, Urine: 1.018 (ref 1.005–1.030)
WBC, UA: >50 WBC/hpf — ABNORMAL HIGH (ref 0–5)
pH: 6 (ref 5.0–8.0)

## 2018-09-28 LAB — WET PREP, GENITAL
Sperm: NONE SEEN
Trich, Wet Prep: NONE SEEN
Yeast Wet Prep HPF POC: NONE SEEN

## 2018-09-28 MED ORDER — METRONIDAZOLE 500 MG PO TABS: 500.0000 mg | ORAL_TABLET | Freq: Two times a day (BID) | ORAL | 0 refills | Status: DC

## 2018-09-28 MED ORDER — SULFAMETHOXAZOLE-TRIMETHOPRIM 800-160 MG PO TABS: 1.0000 | ORAL_TABLET | Freq: Two times a day (BID) | ORAL | 0 refills | Status: AC

## 2018-09-28 MED ORDER — CEFTRIAXONE SODIUM 250 MG IJ SOLR
250.0000 mg | Freq: Once | INTRAMUSCULAR | Status: AC
  Administered 2018-09-28: 250 mg via INTRAMUSCULAR
  Filled 2018-09-28: qty 250

## 2018-09-28 MED ORDER — DOXYCYCLINE HYCLATE 100 MG PO CAPS
100.0000 mg | ORAL_CAPSULE | Freq: Two times a day (BID) | ORAL | 0 refills | Status: AC
Start: 2018-09-28 — End: 2018-10-12

## 2018-09-28 MED ORDER — LIDOCAINE HCL (PF) 1 % IJ SOLN
INTRAMUSCULAR | Status: AC
  Filled 2018-09-28: qty 5

## 2018-09-28 NOTE — ED Triage Notes (Signed)
Patient complains of dysuria x 4 days. Denies fever, n/v, d.
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
## 2018-09-28 NOTE — ED Notes (Signed)
Pt getting undress.  Pelvic exam set up in room

## 2018-09-28 NOTE — ED Notes (Signed)
Pt now requesting for STD check, states the condom broke 4 days ago.

## 2018-09-28 NOTE — Discharge Instructions (Signed)
You were seen in the ED today for burning when you pee as well as abdominal pain We have diagnosed you with a UTI, bacterial vaginosis, and pelvic inflammatory disease (PID) Please take all of the medications as prescribed Please refrain from alcohol while you are on metronidazole (flagyl) as you can have a very bad reaction  Please inform all of your partners that you are being tested/treated  RETURN TO THE ED IF YOU HAVE ANY WORSENING SYMPTOMS Follow up with your PCP

## 2018-09-30 LAB — RPR: RPR Ser Ql: NONREACTIVE

## 2018-09-30 LAB — HIV ANTIBODY (ROUTINE TESTING W REFLEX): HIV Screen 4th Generation wRfx: NONREACTIVE

## 2018-10-01 LAB — URINE CULTURE: Culture: >=100000 — AB

## 2018-10-01 LAB — GC/CHLAMYDIA PROBE AMP (~~LOC~~) NOT AT ARMC
Chlamydia: POSITIVE — AB
Neisseria Gonorrhea: NEGATIVE

## 2018-10-02 ENCOUNTER — Telehealth: Payer: Self-pay | Admitting: Emergency Medicine

## 2018-10-02 NOTE — Telephone Encounter (Signed)
Post ED Visit - Positive Culture Follow-up  Culture report reviewed by antimicrobial stewardship pharmacist: [place] Team []  Elenor Quinones, Pharm.D. []  Heide Guile, Pharm.D., BCPS AQ-ID []  Parks Neptune, Pharm.D., BCPS []  Alycia Rossetti, Pharm.D., BCPS []  [place], Pharm.D., BCPS, AAHIVP []  Legrand Como, Pharm.D., BCPS, AAHIVP []  Salome Arnt, PharmD, BCPS []  Johnnette Gourd, PharmD, BCPS []  Hughes Better, PharmD, BCPS []  Leeroy Cha, PharmD []  Laqueta Linden, PharmD, BCPS []  Albertina Parr, PharmD Elicia Lamp PharmD  [facility] [address] Team []  Leodis Sias, PharmD []  Lindell Spar, PharmD []  Royetta Asal, PharmD []  Graylin Shiver, Rph []  Rema Fendt) Glennon Mac, PharmD []  Arlyn Dunning, PharmD []  Netta Cedars, PharmD []  Dia Sitter, PharmD []  Leone Haven, PharmD []  Gretta Arab, PharmD []  Theodis Shove, PharmD []  Peggyann Juba, PharmD []  Reuel Boom, PharmD   Positive urine culture Treated with sulfamethoxazole-trimethoprim and doxycycline, organism sensitive to the same and no further patient follow-up is required at this time.  Hazle Nordmann 10/02/2018, 12:51 PM

## 2018-11-25 ENCOUNTER — Other Ambulatory Visit: Payer: Self-pay

## 2018-11-25 ENCOUNTER — Emergency Department (HOSPITAL_COMMUNITY)
Admission: EM | Admit: 2018-11-25 | Discharge: 2018-11-25 | Discharge status: Against Medical Advice | Payer: Medicaid Other | Attending: Emergency Medicine | Admitting: Emergency Medicine

## 2018-11-25 DIAGNOSIS — M25511 Pain in right shoulder: Secondary | ICD-10-CM | POA: Insufficient documentation

## 2018-11-25 DIAGNOSIS — R0789 Other chest pain: Secondary | ICD-10-CM | POA: Insufficient documentation

## 2018-11-25 DIAGNOSIS — R0781 Pleurodynia: Secondary | ICD-10-CM
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
## 2018-11-25 NOTE — ED Notes (Signed)
Patient asked when xray will be done. Called radiology stated xray will be completed shortly patient notified and will wait.

## 2018-11-25 NOTE — ED Notes (Signed)
Patient spoke with nurse and provider stated wants to leave without having a xray. While patient walking another provider spoke to patient and patient still wants to leave.
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
## 2018-12-30 ENCOUNTER — Other Ambulatory Visit: Payer: Self-pay

## 2018-12-30 ENCOUNTER — Emergency Department (HOSPITAL_COMMUNITY)
Admission: EM | Admit: 2018-12-30 | Discharge: 2018-12-30 | Disposition: A | Discharge status: Home / Self Care | Payer: Self-pay

## 2020-03-19 ENCOUNTER — Other Ambulatory Visit: Payer: Self-pay

## 2020-03-19 ENCOUNTER — Emergency Department (HOSPITAL_COMMUNITY)
Admission: EM | Admit: 2020-03-19 | Discharge: 2020-03-19 | Discharge status: Against Medical Advice | Payer: Medicaid Other | Attending: Emergency Medicine | Admitting: Emergency Medicine

## 2020-03-19 ENCOUNTER — Encounter (HOSPITAL_COMMUNITY): Payer: Self-pay | Admitting: Emergency Medicine

## 2020-03-19 DIAGNOSIS — W01198A Fall on same level from slipping, tripping and stumbling with subsequent striking against other object, initial encounter: Secondary | ICD-10-CM | POA: Insufficient documentation

## 2020-03-19 DIAGNOSIS — Y92511 Restaurant or cafe as the place of occurrence of the external cause: Secondary | ICD-10-CM | POA: Insufficient documentation

## 2020-03-19 DIAGNOSIS — R519 Headache, unspecified: Secondary | ICD-10-CM | POA: Diagnosis not present

## 2020-03-19 DIAGNOSIS — R55 Syncope and collapse: Secondary | ICD-10-CM | POA: Diagnosis not present

## 2020-03-19 LAB — CBG MONITORING, ED: Glucose-Capillary: 83 mg/dL (ref 70–99)

## 2020-03-19 NOTE — ED Provider Notes (Signed)
I signed up to see patient however nursing staff have come in and state patient does not want to wait to be seen and is signing out AMA.  She is here for syncope, her EKG is without changes.   EKG Interpretation  Date/Time:  Friday March 19 2020 22:55:59 EST Ventricular Rate:  84 PR Interval:  172 QRS Duration: 90 QT Interval:  346 QTC Calculation: 408 R Axis:   83 Text Interpretation: Normal sinus rhythm with sinus arrhythmia Normal ECG No significant change since last tracing 09 Aug 2016 Confirmed by Devoria Albe ([id number]) on 03/19/2020 11:18:49 PM         Devoria Albe, MD 03/19/20 2320
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
## 2020-03-19 NOTE — ED Notes (Signed)
Dr. Lynelle Doctor notified of pt desire to leave AMA

## 2020-10-08 ENCOUNTER — Encounter: Payer: Self-pay | Admitting: Family Medicine

## 2023-07-17 LAB — HEPATITIS C ANTIBODY: HCV Ab: NEGATIVE

## 2023-07-17 LAB — OB RESULTS CONSOLE HIV ANTIBODY (ROUTINE TESTING): HIV: NONREACTIVE

## 2023-07-17 LAB — OB RESULTS CONSOLE ANTIBODY SCREEN: Antibody Screen: NEGATIVE

## 2023-07-17 LAB — OB RESULTS CONSOLE RPR: RPR: NONREACTIVE

## 2023-07-17 LAB — OB RESULTS CONSOLE RUBELLA ANTIBODY, IGM: Rubella: IMMUNE

## 2023-07-17 LAB — OB RESULTS CONSOLE HEPATITIS B SURFACE ANTIGEN: Hepatitis B Surface Ag: NEGATIVE

## 2023-10-04 NOTE — Telephone Encounter (Signed)
 I spoke with patient and scheduled her ultrasound appointment. She was given the address/phone number to the [facility] location.  Voicemail left for Arland at the referring office with patient appointment information.

## 2023-10-23 ENCOUNTER — Other Ambulatory Visit: Payer: Self-pay

## 2023-10-23 ENCOUNTER — Encounter (HOSPITAL_COMMUNITY): Payer: Self-pay | Admitting: Obstetrics and Gynecology

## 2023-10-23 ENCOUNTER — Inpatient Hospital Stay (HOSPITAL_COMMUNITY)
Admission: AD | Admit: 2023-10-23 | Discharge: 2023-10-23 | Disposition: A | Discharge status: Home / Self Care | Attending: Obstetrics and Gynecology | Admitting: Obstetrics and Gynecology

## 2023-10-23 DIAGNOSIS — O99342 Other mental disorders complicating pregnancy, second trimester: Secondary | ICD-10-CM | POA: Diagnosis not present

## 2023-10-23 DIAGNOSIS — M549 Dorsalgia, unspecified: Secondary | ICD-10-CM | POA: Diagnosis present

## 2023-10-23 DIAGNOSIS — F419 Anxiety disorder, unspecified: Secondary | ICD-10-CM | POA: Diagnosis not present

## 2023-10-23 DIAGNOSIS — O99891 Other specified diseases and conditions complicating pregnancy: Secondary | ICD-10-CM | POA: Diagnosis not present

## 2023-10-23 DIAGNOSIS — R110 Nausea: Secondary | ICD-10-CM | POA: Diagnosis present

## 2023-10-23 DIAGNOSIS — M5459 Other low back pain: Secondary | ICD-10-CM | POA: Diagnosis not present

## 2023-10-23 DIAGNOSIS — Z3A23 23 weeks gestation of pregnancy: Secondary | ICD-10-CM

## 2023-10-23 DIAGNOSIS — R102 Pelvic and perineal pain: Secondary | ICD-10-CM | POA: Diagnosis present

## 2023-10-23 DIAGNOSIS — M545 Low back pain, unspecified: Secondary | ICD-10-CM

## 2023-10-23 LAB — URINALYSIS, ROUTINE W REFLEX MICROSCOPIC
Bilirubin Urine: NEGATIVE
Glucose, UA: NEGATIVE mg/dL
Hgb urine dipstick: NEGATIVE
Ketones, ur: 5 mg/dL — AB
Leukocytes,Ua: NEGATIVE
Nitrite: NEGATIVE
Protein, ur: NEGATIVE mg/dL
Specific Gravity, Urine: 1.015 (ref 1.005–1.030)
pH: 6 (ref 5.0–8.0)

## 2023-10-23 LAB — WET PREP, GENITAL
Clue Cells Wet Prep HPF POC: NONE SEEN
Sperm: NONE SEEN
Trich, Wet Prep: NONE SEEN
WBC, Wet Prep HPF POC: <10 (ref <10)
Yeast Wet Prep HPF POC: NONE SEEN

## 2023-10-23 LAB — FETAL FIBRONECTIN: Fetal Fibronectin: NEGATIVE

## 2023-10-23 MED ORDER — ACETAMINOPHEN 500 MG PO TABS
1000.0000 mg | ORAL_TABLET | Freq: Once | ORAL | Status: AC
  Administered 2023-10-23: 1000 mg via ORAL
  Filled 2023-10-23: qty 2

## 2023-10-23 NOTE — MAU Note (Signed)
 Joyce Baker is a 27 y.o. at [redacted]w[redacted]d here in MAU reporting: vaginal and back pain and nausea. Called phys 4 women and they told her to come in if it didn't get better and it didn't. Denies VB or discharge. Reports +FM.    Onset of complaint: over the weekend but today has been the worst.  Pain score: 7/10 There were no vitals filed for this visit.   FHT: 155  Lab orders placed from triage: UA
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
## 2023-10-30 NOTE — Telephone Encounter (Signed)
-----   Message from Donnice Lauth, MD sent at 10/29/2023  9:03 PM EDT ----- Regarding: GC visit [place] Dear Junella-  Could you (or ask [place] office) call to schedule this pt for genetic counseling visit for EIF? Can be a virtual visit- thank you so much  [facility]

## 2023-10-30 NOTE — Telephone Encounter (Signed)
 I spoke with patient and scheduled her genetic counseling virtual visit. She was sent a e-mail to create her portal.

## 2023-11-04 ENCOUNTER — Other Ambulatory Visit: Payer: Self-pay

## 2023-11-04 ENCOUNTER — Inpatient Hospital Stay (HOSPITAL_COMMUNITY)
Admission: AD | Admit: 2023-11-04 | Discharge: 2023-11-04 | Disposition: A | Discharge status: Home / Self Care | Attending: Obstetrics & Gynecology | Admitting: Obstetrics & Gynecology

## 2023-11-04 DIAGNOSIS — O26892 Other specified pregnancy related conditions, second trimester: Secondary | ICD-10-CM | POA: Diagnosis not present

## 2023-11-04 DIAGNOSIS — B029 Zoster without complications: Secondary | ICD-10-CM | POA: Diagnosis not present

## 2023-11-04 DIAGNOSIS — R21 Rash and other nonspecific skin eruption: Secondary | ICD-10-CM | POA: Diagnosis present

## 2023-11-04 DIAGNOSIS — O98512 Other viral diseases complicating pregnancy, second trimester: Secondary | ICD-10-CM | POA: Diagnosis not present

## 2023-11-04 DIAGNOSIS — R42 Dizziness and giddiness: Secondary | ICD-10-CM | POA: Diagnosis not present

## 2023-11-04 DIAGNOSIS — R55 Syncope and collapse: Secondary | ICD-10-CM | POA: Diagnosis present

## 2023-11-04 DIAGNOSIS — Z3A25 25 weeks gestation of pregnancy: Secondary | ICD-10-CM | POA: Diagnosis not present

## 2023-11-04 LAB — CBC WITH DIFFERENTIAL/PLATELET
Abs Immature Granulocytes: 0.07 K/uL (ref 0.00–0.07)
Basophils Absolute: 0 K/uL (ref 0.0–0.1)
Basophils Relative: 0 %
Eosinophils Absolute: 0.1 K/uL (ref 0.0–0.5)
Eosinophils Relative: 1 %
HCT: 32 % — ABNORMAL LOW (ref 36.0–46.0)
Hemoglobin: 11 g/dL — ABNORMAL LOW (ref 12.0–15.0)
Immature Granulocytes: 1 %
Lymphocytes Relative: 14 %
Lymphs Abs: 1.1 K/uL (ref 0.7–4.0)
MCH: 33.7 pg (ref 26.0–34.0)
MCHC: 34.4 g/dL (ref 30.0–36.0)
MCV: 98.2 fL (ref 80.0–100.0)
Monocytes Absolute: 0.4 K/uL (ref 0.1–1.0)
Monocytes Relative: 6 %
Neutro Abs: 6.1 K/uL (ref 1.7–7.7)
Neutrophils Relative %: 78 %
Platelets: 262 K/uL (ref 150–400)
RBC: 3.26 MIL/uL — ABNORMAL LOW (ref 3.87–5.11)
RDW: 12 % (ref 11.5–15.5)
WBC: 7.8 K/uL (ref 4.0–10.5)
nRBC: 0 % (ref 0.0–0.2)

## 2023-11-04 LAB — URINALYSIS, ROUTINE W REFLEX MICROSCOPIC
Bilirubin Urine: NEGATIVE
Glucose, UA: NEGATIVE mg/dL
Hgb urine dipstick: NEGATIVE
Ketones, ur: NEGATIVE mg/dL
Leukocytes,Ua: NEGATIVE
Nitrite: NEGATIVE
Protein, ur: 100 mg/dL — AB
Specific Gravity, Urine: 1.017 (ref 1.005–1.030)
pH: 8 (ref 5.0–8.0)

## 2023-11-04 LAB — COMPREHENSIVE METABOLIC PANEL WITH GFR
ALT: 18 U/L (ref 0–44)
AST: 18 U/L (ref 15–41)
Albumin: 2.8 g/dL — ABNORMAL LOW (ref 3.5–5.0)
Alkaline Phosphatase: 54 U/L (ref 38–126)
Anion gap: 7 (ref 5–15)
BUN: 7 mg/dL (ref 6–20)
CO2: 24 mmol/L (ref 22–32)
Calcium: 8.8 mg/dL — ABNORMAL LOW (ref 8.9–10.3)
Chloride: 105 mmol/L (ref 98–111)
Creatinine, Ser: 0.59 mg/dL (ref 0.44–1.00)
GFR, Estimated: >60 mL/min (ref >60)
Glucose, Bld: 91 mg/dL (ref 70–99)
Potassium: 3.8 mmol/L (ref 3.5–5.1)
Sodium: 136 mmol/L (ref 135–145)
Total Bilirubin: 0.2 mg/dL (ref 0.0–1.2)
Total Protein: 5.7 g/dL — ABNORMAL LOW (ref 6.5–8.1)

## 2023-11-04 MED ORDER — ONDANSETRON 4 MG PO TBDP
4.0000 mg | ORAL_TABLET | Freq: Once | ORAL | Status: DC
  Filled 2023-11-04: qty 1

## 2023-11-04 MED ORDER — VALACYCLOVIR HCL 1 G PO TABS: 1000.0000 mg | ORAL_TABLET | Freq: Three times a day (TID) | ORAL | 0 refills | Status: AC
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
## 2023-11-05 LAB — CULTURE, OB URINE

## 2023-12-18 ENCOUNTER — Encounter: Payer: Self-pay | Admitting: *Deleted

## 2023-12-19 ENCOUNTER — Ambulatory Visit (INDEPENDENT_AMBULATORY_CARE_PROVIDER_SITE_OTHER)

## 2023-12-19 ENCOUNTER — Encounter: Payer: Self-pay | Admitting: Cardiology

## 2023-12-19 ENCOUNTER — Ambulatory Visit: Attending: Cardiovascular Disease | Admitting: Cardiology

## 2023-12-19 VITALS — BP 106/68 | HR 96 | Ht 62.0 in | Wt 176.4 lb

## 2023-12-19 DIAGNOSIS — R55 Syncope and collapse: Secondary | ICD-10-CM | POA: Diagnosis not present

## 2023-12-19 NOTE — Progress Notes (Signed)
 Cardio-Obstetrics Clinic  New Evaluation  Date:  12/22/2023   ID:  Joyce Baker, DOB October 26, 1995, MRN [id number]  PCP:  Patient, No Pcp Per   [facility] Providers Cardiologist:  Dub Huntsman, DO  Electrophysiologist:  None       Referring MD: Lequita Evalene LABOR, MD   Chief Complaint:  I am having palpitations  History of Present Illness:    Joyce Baker is a 28 y.o. female [G2P1001] who is being seen today for the evaluation of palpitations at the request of Lequita Evalene LABOR, MD.   Presents with a longstanding history of intermittent palpitations as well as syncope episode going back from the time she was a child she tells me she was have these episodes where she will get palpitations and at times will have to lie down on the ground to feel better and not passed out.  In recent years she has had few episodes but was more remarkable is an episode she had on November 04, 2023 during this pregnancy.  After that she has had similar episodes 1 when she was going for breakfast on the Sunday had to leave the restaurant because she started to feel lightheaded sweaty and thought she was going to pass out went to her car.  She is concerned about this  Her OB has taken her out of work  Prior CV Studies Reviewed: The following studies were reviewed today: None   Past Medical History:  Diagnosis Date   ADHD (attention deficit hyperactivity disorder)    Anxiety    Depression    Gastroesophageal reflux    Oppositional defiant disorder     Past Surgical History:  Procedure Laterality Date   CESAREAN SECTION     TONSILLECTOMY     TONSILLECTOMY AND ADENOIDECTOMY        OB History     Gravida  2   Para  1   Term  1   Preterm      AB      Living  1      SAB      IAB      Ectopic      Multiple      Live Births                  Current Medications: Current Meds  Medication Sig   hydrOXYzine  (ATARAX ) 25 MG tablet Take 25 mg by mouth 3  (three) times daily as needed.   Prenatal Vit-Fe Fumarate-FA (PRENATAL MULTIVITAMIN) TABS tablet Take 1 tablet by mouth daily at 12 noon.   sertraline (ZOLOFT) 50 MG tablet Take 50 mg by mouth daily. PATIENT ONLY TAKES 25 MG     Allergies:   Amoxicillin   Social History   Socioeconomic History   Marital status: Single    Spouse name: Not on file   Number of children: Not on file   Years of education: Not on file   Highest education level: Not on file  Occupational History   Not on file  Tobacco Use   Smoking status: Never   Smokeless tobacco: Never  Vaping Use   Vaping status: Never Used  Substance and Sexual Activity   Alcohol use: No   Drug use: No    Comment: denies current use   Sexual activity: Yes  Other Topics Concern   Not on file  Social History Narrative   ** Merged History Encounter **       Lives with mom.  Sees dad occasionally. 11 th grade home school. Softball.    Social Drivers of Corporate investment banker Strain: Not on file  Food Insecurity: No Food Insecurity (11/04/2023)   Hunger Vital Sign    Worried About Running Out of Food in the Last Year: Never true    Ran Out of Food in the Last Year: Never true  Transportation Needs: No Transportation Needs (11/04/2023)   PRAPARE - Administrator, Civil Service (Medical): No    Lack of Transportation (Non-Medical): No  Physical Activity: Not on file  Stress: Not on file  Social Connections: Not on file      Family History  Problem Relation Age of Onset   Bipolar disorder Mother    Drug abuse Mother    Lupus Mother    Fibromyalgia Mother    Cholelithiasis Mother    Alcohol abuse Father    ADD / ADHD Sister    Depression Sister    Thyroid  disease Neg Hx    Celiac disease Neg Hx    Ulcers Neg Hx    Cholelithiasis Maternal Grandmother    Cholelithiasis Maternal Grandfather       ROS:   Please see the history of present illness.    Palpitations All other systems reviewed and are  negative.   Labs/EKG Reviewed:    EKG:   EKG not ordered today.    Recent Labs: 11/04/2023: ALT 18; BUN 7; Creatinine, Ser 0.59; Hemoglobin 11.0; Platelets 262; Potassium 3.8; Sodium 136   Recent Lipid Panel No results found for: CHOL, TRIG, HDL, CHOLHDL, LDLCALC, LDLDIRECT  Physical Exam:    VS:  BP 106/68 (BP Location: Left Arm, Patient Position: Sitting, Cuff Size: Normal)   Pulse 96   Ht 5' 2 (1.575 m)   Wt 176 lb 6.4 oz (80 kg)   SpO2 95%   BMI 32.26 kg/m     Wt Readings from Last 3 Encounters:  12/19/23 176 lb 6.4 oz (80 kg)  10/23/23 160 lb 8 oz (72.8 kg)  03/19/20 135 lb (61.2 kg)     GEN:  Well nourished, well developed in no acute distress HEENT: Normal NECK: No JVD; No carotid bruits LYMPHATICS: No lymphadenopathy CARDIAC: RRR, no murmurs, rubs, gallops RESPIRATORY:  Clear to auscultation without rales, wheezing or rhonchi  ABDOMEN: Soft, non-tender, non-distended MUSCULOSKELETAL:  No edema; No deformity  SKIN: Warm and dry NEUROLOGIC:  Alert and oriented x 3 PSYCHIATRIC:  Normal affect    Risk Assessment/Risk Calculators:     CARPREG II Risk Prediction Index Score:  1.  The patient's risk for a primary cardiac event is 5%.   Modified World Health Organization [facility]) Classification of Maternal CV Risk   Class I         ASSESSMENT & PLAN:    Palpitations   Her OB has taken her out of work due to this situation.  I shared with the patient I will be able to share my note with the OB team.  As currently I do not have a cardiovascular diagnoses for what this she is experiencing and is highly being exacerbated by her pregnancy so it will be best that her OB team work out with her work for her FMLA and short-term disability.    Patient Instructions  Medication Instructions:  Your physician recommends that you continue on your current medications as directed. Please refer to the Current Medication list given to you today.  *If you need a  refill  on your cardiac medications before your next appointment, please call your pharmacy*  Testing/Procedures: Your physician has requested that you have an echocardiogram. Echocardiography is a painless test that uses sound waves to create images of your heart. It provides your doctor with information about the size and shape of your heart and how well your heart's chambers and valves are working. This procedure takes approximately one hour. There are no restrictions for this procedure. Please do NOT wear cologne, perfume, aftershave, or lotions (deodorant is allowed). Please arrive 15 minutes prior to your appointment time.  Please note: We ask at that you not bring children with you during ultrasound (echo/ vascular) testing. Due to room size and safety concerns, children are not allowed in the ultrasound rooms during exams. Our front office staff cannot provide observation of children in our lobby area while testing is being conducted. An adult accompanying a patient to their appointment will only be allowed in the ultrasound room at the discretion of the ultrasound technician under special circumstances. We apologize for any inconvenience.  ZIO AT Long term monitor-Live Telemetry  Your physician has requested you wear a ZIO patch monitor for 7 days.  This is a single patch monitor. [employer] supplies one patch monitor per enrollment. Additional  stickers are not available.  Please do not apply patch if you will be having a Nuclear Stress Test, Echocardiogram, Cardiac CT, MRI,  or Chest Xray during the period you would be wearing the monitor. The patch cannot be worn during  these tests. You cannot remove and re-apply the ZIO AT patch monitor.  Your ZIO patch monitor will be mailed 3 day USPS to your address on file. It may take 3-5 days to  receive your monitor after you have been enrolled.  Once you have received your monitor, please review the enclosed instructions. Your monitor has   already been registered assigning a specific monitor serial # to you.   Billing and Patient Assistance Program information  M[facility]eth has been supplied with any insurance information on record for billing. [employer] offers a sliding scale Patient Assistance Program for patients without insurance, or whose  insurance does not completely cover the cost of the ZIO patch monitor. You must apply for the  Patient Assistance Program to qualify for the discounted rate. To apply, call [employer] at [phone],  select option 4, select option 2 , ask to apply for the Patient Assistance Program, (you can request an  interpreter if needed). [employer] will ask your household income and how many people are in your  household. [employer] will quote your out-of-pocket cost based on this information. They will also be able  to set up a 12 month interest free payment plan if needed.  Applying the monitor   Shave hair from upper left chest.  Hold the abrader disc by orange tab. Rub the abrader in 40 strokes over left upper chest as indicated in  your monitor instructions.  Clean area with 4 enclosed alcohol pads. Use all pads to ensure the area is cleaned thoroughly. Let  dry.  Apply patch as indicated in monitor instructions. Patch will be placed under collarbone on left side of  chest with arrow pointing upward.  Rub patch adhesive wings for 2 minutes. Remove the white label marked 1. Remove the white label  marked 2. Rub patch adhesive wings for 2 additional minutes.  While looking in a mirror, press and release button in center of patch. A small green light will flash 3-4  times. This will be your only indicator that the monitor has been turned on.  Do not shower for the first 24 hours. You may shower after the first 24 hours.  Press the button if you feel a symptom. You will hear a small click. Record Date, Time and Symptom in  the Patient Log.   Starting the Gateway  In your kit there is a Orthoptist box the size of a cellphone. This is Buyer, retail. It transmits all your  recorded data to [facility]. This box must always stay within 10 feet of you. Open the box and push the *  button. There will be a light that blinks orange and then green a few times. When the light stops  blinking, the Gateway is connected to the ZIO patch. Call [employer] at [phone] to confirm your monitor is transmitting.  Returning your monitor  Remove your patch and place it inside the Gateway. In the lower half of the Gateway there is a white  bag with prepaid postage on it. Place Gateway in bag and seal. Mail package back to [place] as soon as  possible. Your physician should have your final report approximately 7 days after you have mailed back  your monitor. Call [facility] Customer Care at [phone] if you have questions regarding your ZIO AT  patch monitor. Call them immediately if you see an orange light blinking on your monitor.  If your monitor falls off in less than 4 days, contact our Monitor department at [phone]. If your  monitor becomes loose or falls off after 4 days call [employer] at [phone] for suggestions on  securing your monitor   Follow-Up: At [facility], you and your health needs are our priority.  As part of our continuing mission to provide you with exceptional heart care, our providers are all part of one team.  This team includes your primary Cardiologist (physician) and Advanced Practice Providers or APPs (Physician Assistants and Nurse Practitioners) who all work together to provide you with the care you need, when you need it.  Your next appointment:   10-12 week(s) via MyChart  Provider:   Maryann Mccall, DO             Dispo:  No follow-ups on file.   Medication Adjustments/Labs and Tests Ordered: Current medicines are reviewed at length with the patient today.  Concerns regarding medicines are outlined above.  Tests  Ordered: Orders Placed This Encounter  Procedures   LONG TERM MONITOR-LIVE TELEMETRY (3-14 DAYS)   ECHOCARDIOGRAM COMPLETE   Medication Changes: No orders of the defined types were placed in this encounter.

## 2023-12-19 NOTE — Patient Instructions (Signed)
 Medication Instructions:  Your physician recommends that you continue on your current medications as directed. Please refer to the Current Medication list given to you today.  *If you need a refill on your cardiac medications before your next appointment, please call your pharmacy*  Testing/Procedures: Your physician has requested that you have an echocardiogram. Echocardiography is a painless test that uses sound waves to create images of your heart. It provides your doctor with information about the size and shape of your heart and how well your heart's chambers and valves are working. This procedure takes approximately one hour. There are no restrictions for this procedure. Please do NOT wear cologne, perfume, aftershave, or lotions (deodorant is allowed). Please arrive 15 minutes prior to your appointment time.  Please note: We ask at that you not bring children with you during ultrasound (echo/ vascular) testing. Due to room size and safety concerns, children are not allowed in the ultrasound rooms during exams. Our front office staff cannot provide observation of children in our lobby area while testing is being conducted. An adult accompanying a patient to their appointment will only be allowed in the ultrasound room at the discretion of the ultrasound technician under special circumstances. We apologize for any inconvenience.  ZIO AT Long term monitor-Live Telemetry  Your physician has requested you wear a ZIO patch monitor for 7 days.  This is a single patch monitor. [employer] supplies one patch monitor per enrollment. Additional  stickers are not available.  Please do not apply patch if you will be having a Nuclear Stress Test, Echocardiogram, Cardiac CT, MRI,  or Chest Xray during the period you would be wearing the monitor. The patch cannot be worn during  these tests. You cannot remove and re-apply the ZIO AT patch monitor.  Your ZIO patch monitor will be mailed 3 day USPS to your  address on file. It may take 3-5 days to  receive your monitor after you have been enrolled.  Once you have received your monitor, please review the enclosed instructions. Your monitor has  already been registered assigning a specific monitor serial # to you.   Billing and Patient Assistance Program information  Meredeth has been supplied with any insurance information on record for billing. [employer] offers a sliding scale Patient Assistance Program for patients without insurance, or whose  insurance does not completely cover the cost of the ZIO patch monitor. You must apply for the  Patient Assistance Program to qualify for the discounted rate. To apply, call [employer] at [phone],  select option 4, select option 2 , ask to apply for the Patient Assistance Program, (you can request an  interpreter if needed). [employer] will ask your household income and how many people are in your  household. [employer] will quote your out-of-pocket cost based on this information. They will also be able  to set up a 12 month interest free payment plan if needed.  Applying the monitor   Shave hair from upper left chest.  Hold the abrader disc by orange tab. Rub the abrader in 40 strokes over left upper chest as indicated in  your monitor instructions.  Clean area with 4 enclosed alcohol pads. Use all pads to ensure the area is cleaned thoroughly. Let  dry.  Apply patch as indicated in monitor instructions. Patch will be placed under collarbone on left side of  chest with arrow pointing upward.  Rub patch adhesive wings for 2 minutes. Remove the white label marked 1. Remove the white label  marked 2. Rub patch adhesive wings for 2 additional minutes.  While looking in a mirror, press and release button in center of patch. A small green light will flash 3-4  times. This will be your only indicator that the monitor has been turned on.  Do not shower for the first 24 hours. You may shower after the first 24  hours.  Press the button if you feel a symptom. You will hear a small click. Record Date, Time and Symptom in  the Patient Log.   Starting the Gateway  In your kit there is a Audiological scientist box the size of a cellphone. This is Buyer, retail. It transmits all your  recorded data to [facility]. This box must always stay within 10 feet of you. Open the box and push the *  button. There will be a light that blinks orange and then green a few times. When the light stops  blinking, the Gateway is connected to the ZIO patch. Call [employer] at [phone] to confirm your monitor is transmitting.  Returning your monitor  Remove your patch and place it inside the Gateway. In the lower half of the Gateway there is a white  bag with prepaid postage on it. Place Gateway in bag and seal. Mail package back to [place] as soon as  possible. Your physician should have your final report approximately 7 days after you have mailed back  your monitor. Call [facility] Customer Care at [phone] if you have questions regarding your ZIO AT  patch monitor. Call them immediately if you see an orange light blinking on your monitor.  If your monitor falls off in less than 4 days, contact our Monitor department at [phone]. If your  monitor becomes loose or falls off after 4 days call [employer] at [phone] for suggestions on  securing your monitor   Follow-Up: At [facility], you and your health needs are our priority.  As part of our continuing mission to provide you with exceptional heart care, our providers are all part of one team.  This team includes your primary Cardiologist (physician) and Advanced Practice Providers or APPs (Physician Assistants and Nurse Practitioners) who all work together to provide you with the care you need, when you need it.  Your next appointment:   10-12 week(s) via MyChart  Provider:   Kardie Tobb, DO

## 2023-12-19 NOTE — Progress Notes (Unsigned)
 Applied a 14 day Zio XT monitor to patient in the office ?

## 2024-01-11 DIAGNOSIS — R55 Syncope and collapse: Secondary | ICD-10-CM

## 2024-01-22 LAB — OB RESULTS CONSOLE GBS: GBS: NEGATIVE

## 2024-01-23 ENCOUNTER — Ambulatory Visit: Payer: Self-pay | Admitting: Cardiology

## 2024-01-28 ENCOUNTER — Telehealth (HOSPITAL_COMMUNITY): Payer: Self-pay | Admitting: *Deleted

## 2024-01-28 ENCOUNTER — Encounter (HOSPITAL_COMMUNITY): Payer: Self-pay

## 2024-01-28 NOTE — Telephone Encounter (Signed)
 Preadmission screen

## 2024-01-28 NOTE — Patient Instructions (Signed)
 RIA REDCAY  01/28/2024   Your procedure is scheduled on:  02/11/2024  Arrive at 1030 at Entrance C on [address] at [facility]  and [address]. You are invited to use the FREE valet parking or use the Visitor's parking deck.  Pick up the phone at the desk and dial [phone].  Call this number if you have problems the morning of surgery: [phone]  Remember:   Do not eat food:(After Midnight) Desps de medianoche.  You may drink clear liquids until  __0830___.  Clear liquids means a liquid you can see thru.  It can have color such as Cola or Kool aid.  Tea is OK and coffee as long as no milk or creamer of any kind.  Take these medicines the morning of surgery with A SIP OF WATER:  none   Do not wear jewelry, make-up or nail polish.  Do not wear lotions, powders, or perfumes. Do not wear deodorant.  Do not shave 48 hours prior to surgery.  Do not bring valuables to the hospital.  [facility] is not   responsible for any belongings or valuables brought to the hospital.  Contacts, dentures or bridgework may not be worn into surgery.  Leave suitcase in the car. After surgery it may be brought to your room.  For patients admitted to the hospital, checkout time is 11:00 AM the day of              discharge.      Please read over the following fact sheets that you were given:     Preparing for Surgery

## 2024-01-29 ENCOUNTER — Encounter (HOSPITAL_COMMUNITY): Payer: Self-pay

## 2024-01-30 ENCOUNTER — Ambulatory Visit (HOSPITAL_COMMUNITY)
Admission: RE | Admit: 2024-01-30 | Discharge: 2024-01-30 | Disposition: A | Discharge status: Home / Self Care | Source: Ambulatory Visit | Attending: Cardiology | Admitting: Cardiology

## 2024-01-30 DIAGNOSIS — R55 Syncope and collapse: Secondary | ICD-10-CM | POA: Insufficient documentation

## 2024-01-30 LAB — ECHOCARDIOGRAM COMPLETE
Area-P 1/2: 3.78 cm2
MV M vel: 5.32 m/s
MV Peak grad: 113.2 mmHg
S' Lateral: 2.7 cm

## 2024-02-08 ENCOUNTER — Encounter (HOSPITAL_COMMUNITY): Payer: Self-pay | Admitting: Anesthesiology

## 2024-02-08 ENCOUNTER — Encounter (HOSPITAL_COMMUNITY)
Admission: RE | Admit: 2024-02-08 | Discharge: 2024-02-08 | Disposition: A | Source: Ambulatory Visit | Attending: Obstetrics and Gynecology

## 2024-02-08 ENCOUNTER — Encounter (HOSPITAL_COMMUNITY): Admission: AD | Disposition: A | Payer: Self-pay | Source: Home / Self Care | Attending: Obstetrics and Gynecology

## 2024-02-08 ENCOUNTER — Encounter (HOSPITAL_COMMUNITY): Payer: Self-pay | Admitting: Obstetrics and Gynecology

## 2024-02-08 ENCOUNTER — Inpatient Hospital Stay (HOSPITAL_COMMUNITY): Admitting: Anesthesiology

## 2024-02-08 ENCOUNTER — Inpatient Hospital Stay (HOSPITAL_COMMUNITY)
Admission: AD | Admit: 2024-02-08 | Discharge: 2024-02-10 | DRG: 787 | Disposition: A | Discharge status: Home / Self Care | Attending: Obstetrics and Gynecology | Admitting: Obstetrics and Gynecology

## 2024-02-08 ENCOUNTER — Other Ambulatory Visit: Payer: Self-pay

## 2024-02-08 DIAGNOSIS — Z3490 Encounter for supervision of normal pregnancy, unspecified, unspecified trimester: Secondary | ICD-10-CM

## 2024-02-08 DIAGNOSIS — O133 Gestational [pregnancy-induced] hypertension without significant proteinuria, third trimester: Principal | ICD-10-CM

## 2024-02-08 DIAGNOSIS — O34219 Maternal care for unspecified type scar from previous cesarean delivery: Secondary | ICD-10-CM | POA: Diagnosis present

## 2024-02-08 DIAGNOSIS — Z3A38 38 weeks gestation of pregnancy: Secondary | ICD-10-CM

## 2024-02-08 DIAGNOSIS — Z98891 History of uterine scar from previous surgery: Secondary | ICD-10-CM

## 2024-02-08 DIAGNOSIS — O9832 Other infections with a predominantly sexual mode of transmission complicating childbirth: Secondary | ICD-10-CM | POA: Diagnosis present

## 2024-02-08 DIAGNOSIS — K219 Gastro-esophageal reflux disease without esophagitis: Secondary | ICD-10-CM | POA: Diagnosis present

## 2024-02-08 DIAGNOSIS — O134 Gestational [pregnancy-induced] hypertension without significant proteinuria, complicating childbirth: Principal | ICD-10-CM | POA: Diagnosis present

## 2024-02-08 DIAGNOSIS — A6000 Herpesviral infection of urogenital system, unspecified: Secondary | ICD-10-CM | POA: Diagnosis present

## 2024-02-08 DIAGNOSIS — O9962 Diseases of the digestive system complicating childbirth: Secondary | ICD-10-CM | POA: Diagnosis present

## 2024-02-08 DIAGNOSIS — O139 Gestational [pregnancy-induced] hypertension without significant proteinuria, unspecified trimester: Secondary | ICD-10-CM | POA: Diagnosis present

## 2024-02-08 HISTORY — DX: Supervision of pregnancy with other poor reproductive or obstetric history, unspecified trimester: O09.299

## 2024-02-08 LAB — COMPREHENSIVE METABOLIC PANEL WITH GFR
ALT: 12 U/L (ref 0–44)
AST: 23 U/L (ref 15–41)
Albumin: 2.9 g/dL — ABNORMAL LOW (ref 3.5–5.0)
Alkaline Phosphatase: 148 U/L — ABNORMAL HIGH (ref 38–126)
Anion gap: 10 (ref 5–15)
BUN: 14 mg/dL (ref 6–20)
CO2: 20 mmol/L — ABNORMAL LOW (ref 22–32)
Calcium: 8.8 mg/dL — ABNORMAL LOW (ref 8.9–10.3)
Chloride: 106 mmol/L (ref 98–111)
Creatinine, Ser: 0.51 mg/dL (ref 0.44–1.00)
GFR, Estimated: >60 mL/min (ref >60)
Glucose, Bld: 81 mg/dL (ref 70–99)
Potassium: 4.1 mmol/L (ref 3.5–5.1)
Sodium: 136 mmol/L (ref 135–145)
Total Bilirubin: 0.7 mg/dL (ref 0.0–1.2)
Total Protein: 6.3 g/dL — ABNORMAL LOW (ref 6.5–8.1)

## 2024-02-08 LAB — URINALYSIS, ROUTINE W REFLEX MICROSCOPIC
Bacteria, UA: NONE SEEN
Bilirubin Urine: NEGATIVE
Glucose, UA: NEGATIVE mg/dL
Hgb urine dipstick: NEGATIVE
Ketones, ur: NEGATIVE mg/dL
Leukocytes,Ua: NEGATIVE
Nitrite: NEGATIVE
Protein, ur: 30 mg/dL — AB
Specific Gravity, Urine: 1.024 (ref 1.005–1.030)
pH: 6 (ref 5.0–8.0)

## 2024-02-08 LAB — CBC
HCT: 34.9 % — ABNORMAL LOW (ref 36.0–46.0)
Hemoglobin: 12.2 g/dL (ref 12.0–15.0)
MCH: 34.7 pg — ABNORMAL HIGH (ref 26.0–34.0)
MCHC: 35 g/dL (ref 30.0–36.0)
MCV: 99.1 fL (ref 80.0–100.0)
Platelets: 253 K/uL (ref 150–400)
RBC: 3.52 MIL/uL — ABNORMAL LOW (ref 3.87–5.11)
RDW: 12.8 % (ref 11.5–15.5)
WBC: 10.3 K/uL (ref 4.0–10.5)
nRBC: 0 % (ref 0.0–0.2)

## 2024-02-08 LAB — PROTEIN / CREATININE RATIO, URINE
Creatinine, Urine: 103 mg/dL
Protein Creatinine Ratio: 0.36 mg/mg{creat} — ABNORMAL HIGH (ref 0.00–0.15)
Total Protein, Urine: 37 mg/dL

## 2024-02-08 LAB — TYPE AND SCREEN
ABO/RH(D): O POS
Antibody Screen: NEGATIVE

## 2024-02-08 LAB — SYPHILIS: RPR W/REFLEX TO RPR TITER AND TREPONEMAL ANTIBODIES, TRADITIONAL SCREENING AND DIAGNOSIS ALGORITHM: RPR Ser Ql: NONREACTIVE

## 2024-02-08 SURGERY — Surgical Case
Anesthesia: Spinal

## 2024-02-08 MED ORDER — DIPHENHYDRAMINE HCL 25 MG PO CAPS: 25.0000 mg | ORAL_CAPSULE | ORAL | Status: DC | PRN

## 2024-02-08 MED ORDER — ACETAMINOPHEN 10 MG/ML IV SOLN
INTRAVENOUS | Status: AC
  Filled 2024-02-08: qty 100

## 2024-02-08 MED ORDER — SCOPOLAMINE 1 MG/3DAYS TD PT72: 1.0000 | MEDICATED_PATCH | Freq: Once | TRANSDERMAL | Status: DC

## 2024-02-08 MED ORDER — MENTHOL 3 MG MT LOZG: 1.0000 | LOZENGE | OROMUCOSAL | Status: DC | PRN

## 2024-02-08 MED ORDER — ONDANSETRON HCL 4 MG/2ML IJ SOLN: 4.0000 mg | Freq: Once | INTRAMUSCULAR | Status: DC | PRN

## 2024-02-08 MED ORDER — ZOLPIDEM TARTRATE 5 MG PO TABS: 5.0000 mg | ORAL_TABLET | Freq: Every evening | ORAL | Status: DC | PRN

## 2024-02-08 MED ORDER — BUPIVACAINE IN DEXTROSE 0.75-8.25 % IT SOLN
INTRATHECAL | Status: DC | PRN
  Administered 2024-02-08: 1.6 mL via INTRATHECAL

## 2024-02-08 MED ORDER — SCOPOLAMINE 1 MG/3DAYS TD PT72
MEDICATED_PATCH | TRANSDERMAL | Status: AC
  Filled 2024-02-08: qty 1

## 2024-02-08 MED ORDER — NALOXONE HCL 4 MG/10ML IJ SOLN: 1.0000 ug/kg/h | INTRAVENOUS | Status: DC | PRN

## 2024-02-08 MED ORDER — SODIUM CHLORIDE 0.9% FLUSH: 3.0000 mL | INTRAVENOUS | Status: DC | PRN

## 2024-02-08 MED ORDER — AMISULPRIDE (ANTIEMETIC) 5 MG/2ML IV SOLN: 10.0000 mg | Freq: Once | INTRAVENOUS | Status: DC | PRN

## 2024-02-08 MED ORDER — TRANEXAMIC ACID-NACL 1000-0.7 MG/100ML-% IV SOLN
INTRAVENOUS | Status: DC | PRN
  Administered 2024-02-08: 1000 mg via INTRAVENOUS

## 2024-02-08 MED ORDER — LACTATED RINGERS IV SOLN: INTRAVENOUS | Status: DC | PRN

## 2024-02-08 MED ORDER — OXYTOCIN-SODIUM CHLORIDE 30-0.9 UT/500ML-% IV SOLN
2.5000 [IU]/h | INTRAVENOUS | Status: AC
  Administered 2024-02-08: 2.5 [IU]/h via INTRAVENOUS
  Filled 2024-02-08: qty 500

## 2024-02-08 MED ORDER — NALOXONE HCL 0.4 MG/ML IJ SOLN: 0.4000 mg | INTRAMUSCULAR | Status: DC | PRN

## 2024-02-08 MED ORDER — KETOROLAC TROMETHAMINE 30 MG/ML IJ SOLN
30.0000 mg | Freq: Four times a day (QID) | INTRAMUSCULAR | Status: AC
  Administered 2024-02-08 – 2024-02-09 (×3): 30 mg via INTRAVENOUS
  Filled 2024-02-08 (×3): qty 1

## 2024-02-08 MED ORDER — METOCLOPRAMIDE HCL 5 MG/ML IJ SOLN
INTRAMUSCULAR | Status: DC | PRN
  Administered 2024-02-08: 5 mg via INTRAVENOUS

## 2024-02-08 MED ORDER — LACTATED RINGERS IV SOLN: INTRAVENOUS | Status: DC

## 2024-02-08 MED ORDER — LABETALOL HCL 5 MG/ML IV SOLN: 40.0000 mg | INTRAVENOUS | Status: DC | PRN

## 2024-02-08 MED ORDER — DEXAMETHASONE SOD PHOSPHATE PF 10 MG/ML IJ SOLN
INTRAMUSCULAR | Status: DC | PRN
  Administered 2024-02-08: 10 mg via INTRAVENOUS

## 2024-02-08 MED ORDER — FENTANYL CITRATE (PF) 100 MCG/2ML IJ SOLN
INTRAMUSCULAR | Status: DC | PRN
  Administered 2024-02-08: 15 ug via INTRATHECAL

## 2024-02-08 MED ORDER — ACETAMINOPHEN 500 MG PO TABS
1000.0000 mg | ORAL_TABLET | Freq: Four times a day (QID) | ORAL | Status: AC
  Administered 2024-02-08 – 2024-02-09 (×4): 1000 mg via ORAL
  Filled 2024-02-08 (×4): qty 2

## 2024-02-08 MED ORDER — KETOROLAC TROMETHAMINE 30 MG/ML IJ SOLN: 30.0000 mg | Freq: Four times a day (QID) | INTRAMUSCULAR | Status: DC | PRN

## 2024-02-08 MED ORDER — SCOPOLAMINE 1 MG/3DAYS TD PT72
MEDICATED_PATCH | TRANSDERMAL | Status: DC | PRN
  Administered 2024-02-08: 1 via TRANSDERMAL

## 2024-02-08 MED ORDER — PHENYLEPHRINE HCL-NACL 20-0.9 MG/250ML-% IV SOLN
INTRAVENOUS | Status: DC | PRN
  Administered 2024-02-08: 60 ug/min via INTRAVENOUS

## 2024-02-08 MED ORDER — MEPERIDINE HCL 25 MG/ML IJ SOLN: 6.2500 mg | INTRAMUSCULAR | Status: DC | PRN

## 2024-02-08 MED ORDER — SIMETHICONE 80 MG PO CHEW: 80.0000 mg | CHEWABLE_TABLET | ORAL | Status: DC | PRN

## 2024-02-08 MED ORDER — OXYTOCIN-SODIUM CHLORIDE 30-0.9 UT/500ML-% IV SOLN
INTRAVENOUS | Status: DC | PRN
  Administered 2024-02-08: 300 mL via INTRAVENOUS

## 2024-02-08 MED ORDER — MORPHINE SULFATE (PF) 0.5 MG/ML IJ SOLN
INTRAMUSCULAR | Status: DC | PRN
  Administered 2024-02-08: 150 ug via INTRATHECAL

## 2024-02-08 MED ORDER — DIPHENHYDRAMINE HCL 25 MG PO CAPS: 25.0000 mg | ORAL_CAPSULE | Freq: Four times a day (QID) | ORAL | Status: DC | PRN

## 2024-02-08 MED ORDER — DIPHENHYDRAMINE HCL 50 MG/ML IJ SOLN
12.5000 mg | INTRAMUSCULAR | Status: DC | PRN
  Administered 2024-02-08 – 2024-02-09 (×2): 12.5 mg via INTRAVENOUS
  Filled 2024-02-08 (×2): qty 1

## 2024-02-08 MED ORDER — ACETAMINOPHEN 325 MG PO TABS
650.0000 mg | ORAL_TABLET | ORAL | Status: DC | PRN
  Administered 2024-02-09 – 2024-02-10 (×3): 650 mg via ORAL
  Filled 2024-02-08 (×5): qty 2

## 2024-02-08 MED ORDER — OXYCODONE HCL 5 MG/5ML PO SOLN: 5.0000 mg | Freq: Once | ORAL | Status: DC | PRN

## 2024-02-08 MED ORDER — HYDROMORPHONE HCL 1 MG/ML IJ SOLN: 0.2500 mg | INTRAMUSCULAR | Status: DC | PRN

## 2024-02-08 MED ORDER — ONDANSETRON HCL 4 MG/2ML IJ SOLN
4.0000 mg | Freq: Three times a day (TID) | INTRAMUSCULAR | Status: DC | PRN
  Administered 2024-02-09: 4 mg via INTRAVENOUS
  Filled 2024-02-08 (×3): qty 2

## 2024-02-08 MED ORDER — CEFAZOLIN SODIUM-DEXTROSE 2-4 GM/100ML-% IV SOLN
2.0000 g | INTRAVENOUS | Status: AC
  Administered 2024-02-08: 2 g via INTRAVENOUS

## 2024-02-08 MED ORDER — MORPHINE SULFATE (PF) 0.5 MG/ML IJ SOLN
INTRAMUSCULAR | Status: AC
  Filled 2024-02-08: qty 10

## 2024-02-08 MED ORDER — OXYCODONE HCL 5 MG PO TABS
5.0000 mg | ORAL_TABLET | ORAL | Status: DC | PRN
  Administered 2024-02-08 – 2024-02-09 (×4): 5 mg via ORAL
  Administered 2024-02-09 – 2024-02-10 (×4): 10 mg via ORAL
  Filled 2024-02-08: qty 2
  Filled 2024-02-08: qty 1
  Filled 2024-02-08: qty 2
  Filled 2024-02-08: qty 1
  Filled 2024-02-08 (×2): qty 2
  Filled 2024-02-08 (×2): qty 1

## 2024-02-08 MED ORDER — VALACYCLOVIR HCL 500 MG PO TABS
500.0000 mg | ORAL_TABLET | Freq: Every day | ORAL | Status: DC
  Administered 2024-02-08 – 2024-02-09 (×2): 500 mg via ORAL
  Filled 2024-02-08 (×2): qty 1

## 2024-02-08 MED ORDER — DIPHENHYDRAMINE HCL 50 MG/ML IJ SOLN
INTRAMUSCULAR | Status: AC
  Filled 2024-02-08: qty 1

## 2024-02-08 MED ORDER — SENNOSIDES-DOCUSATE SODIUM 8.6-50 MG PO TABS
2.0000 | ORAL_TABLET | ORAL | Status: DC
  Administered 2024-02-08 – 2024-02-10 (×3): 2 via ORAL
  Filled 2024-02-08 (×3): qty 2

## 2024-02-08 MED ORDER — COCONUT OIL OIL: 1.0000 | TOPICAL_OIL | Status: DC | PRN

## 2024-02-08 MED ORDER — DIPHENHYDRAMINE HCL 50 MG/ML IJ SOLN
INTRAMUSCULAR | Status: DC | PRN
  Administered 2024-02-08: 12.5 mg via INTRAVENOUS

## 2024-02-08 MED ORDER — SIMETHICONE 80 MG PO CHEW
80.0000 mg | CHEWABLE_TABLET | Freq: Three times a day (TID) | ORAL | Status: DC
  Administered 2024-02-08 – 2024-02-10 (×5): 80 mg via ORAL
  Filled 2024-02-08 (×5): qty 1

## 2024-02-08 MED ORDER — TRANEXAMIC ACID-NACL 1000-0.7 MG/100ML-% IV SOLN
INTRAVENOUS | Status: AC
  Filled 2024-02-08: qty 100

## 2024-02-08 MED ORDER — PRENATAL MULTIVITAMIN CH
1.0000 | ORAL_TABLET | Freq: Every day | ORAL | Status: DC
  Administered 2024-02-08 – 2024-02-09 (×2): 1 via ORAL
  Filled 2024-02-08 (×2): qty 1

## 2024-02-08 MED ORDER — DEXMEDETOMIDINE HCL IN NACL 80 MCG/20ML IV SOLN
INTRAVENOUS | Status: DC | PRN
  Administered 2024-02-08: 8 ug via INTRAVENOUS

## 2024-02-08 MED ORDER — OXYCODONE-ACETAMINOPHEN 5-325 MG PO TABS: 1.0000 | ORAL_TABLET | ORAL | Status: DC | PRN

## 2024-02-08 MED ORDER — KETOROLAC TROMETHAMINE 30 MG/ML IJ SOLN
30.0000 mg | Freq: Once | INTRAMUSCULAR | Status: AC | PRN
  Administered 2024-02-08: 30 mg via INTRAVENOUS

## 2024-02-08 MED ORDER — WITCH HAZEL-GLYCERIN EX PADS: 1.0000 | MEDICATED_PAD | CUTANEOUS | Status: DC | PRN

## 2024-02-08 MED ORDER — PHENYLEPHRINE HCL-NACL 20-0.9 MG/250ML-% IV SOLN
INTRAVENOUS | Status: AC
  Filled 2024-02-08: qty 250

## 2024-02-08 MED ORDER — DIBUCAINE (PERIANAL) 1 % EX OINT: 1.0000 | TOPICAL_OINTMENT | CUTANEOUS | Status: DC | PRN

## 2024-02-08 MED ORDER — LABETALOL HCL 5 MG/ML IV SOLN: 20.0000 mg | INTRAVENOUS | Status: DC | PRN

## 2024-02-08 MED ORDER — ONDANSETRON HCL 4 MG/2ML IJ SOLN
INTRAMUSCULAR | Status: DC | PRN
  Administered 2024-02-08: 4 mg via INTRAVENOUS

## 2024-02-08 MED ORDER — HYDRALAZINE HCL 20 MG/ML IJ SOLN: 10.0000 mg | INTRAMUSCULAR | Status: DC | PRN

## 2024-02-08 MED ORDER — DEXMEDETOMIDINE HCL IN NACL 80 MCG/20ML IV SOLN
INTRAVENOUS | Status: AC
  Filled 2024-02-08: qty 20

## 2024-02-08 MED ORDER — SOD CITRATE-CITRIC ACID 500-334 MG/5ML PO SOLN
30.0000 mL | ORAL | Status: AC
  Administered 2024-02-08: 30 mL via ORAL
  Filled 2024-02-08: qty 30

## 2024-02-08 MED ORDER — ACETAMINOPHEN 10 MG/ML IV SOLN
INTRAVENOUS | Status: DC | PRN
  Administered 2024-02-08: 1000 mg via INTRAVENOUS

## 2024-02-08 MED ORDER — PRENATAL MULTIVITAMIN CH: 1.0000 | ORAL_TABLET | Freq: Every day | ORAL | Status: DC

## 2024-02-08 MED ORDER — IBUPROFEN 600 MG PO TABS
600.0000 mg | ORAL_TABLET | Freq: Four times a day (QID) | ORAL | Status: DC | PRN
  Administered 2024-02-09 – 2024-02-10 (×5): 600 mg via ORAL
  Filled 2024-02-08 (×5): qty 1

## 2024-02-08 MED ORDER — OXYTOCIN-SODIUM CHLORIDE 30-0.9 UT/500ML-% IV SOLN
INTRAVENOUS | Status: AC
  Filled 2024-02-08: qty 500

## 2024-02-08 MED ORDER — LABETALOL HCL 5 MG/ML IV SOLN: 80.0000 mg | INTRAVENOUS | Status: DC | PRN

## 2024-02-08 MED ORDER — ONDANSETRON HCL 4 MG/2ML IJ SOLN
INTRAMUSCULAR | Status: AC
  Filled 2024-02-08: qty 2

## 2024-02-08 MED ORDER — METOCLOPRAMIDE HCL 5 MG/ML IJ SOLN
10.0000 mg | Freq: Once | INTRAMUSCULAR | Status: AC
  Administered 2024-02-08: 10 mg via INTRAVENOUS
  Filled 2024-02-08: qty 2

## 2024-02-08 MED ORDER — OXYCODONE HCL 5 MG PO TABS: 5.0000 mg | ORAL_TABLET | Freq: Once | ORAL | Status: DC | PRN

## 2024-02-08 MED ORDER — MEASLES, MUMPS & RUBELLA VAC ~~LOC~~ SUSR: 0.5000 mL | Freq: Once | SUBCUTANEOUS | Status: DC

## 2024-02-08 MED ORDER — FENTANYL CITRATE (PF) 100 MCG/2ML IJ SOLN
INTRAMUSCULAR | Status: AC
  Filled 2024-02-08: qty 2

## 2024-02-08 MED ORDER — SERTRALINE HCL 25 MG PO TABS
25.0000 mg | ORAL_TABLET | Freq: Every day | ORAL | Status: DC
  Administered 2024-02-08 – 2024-02-09 (×2): 25 mg via ORAL
  Filled 2024-02-08 (×3): qty 1

## 2024-02-08 MED ORDER — KETOROLAC TROMETHAMINE 30 MG/ML IJ SOLN
INTRAMUSCULAR | Status: AC
  Filled 2024-02-08: qty 1

## 2024-02-08 SURGICAL SUPPLY — 27 items
BENZOIN TINCTURE PRP APPL 2/3 (GAUZE/BANDAGES/DRESSINGS) IMPLANT
CHLORAPREP W/TINT 26 (MISCELLANEOUS) ×2 IMPLANT
CLAMP UMBILICAL CORD (MISCELLANEOUS) ×1 IMPLANT
CLOTH BEACON ORANGE TIMEOUT ST (SAFETY) ×1 IMPLANT
DRSG OPSITE POSTOP 4X10 (GAUZE/BANDAGES/DRESSINGS) ×1 IMPLANT
ELECTRODE REM PT RTRN 9FT ADLT (ELECTROSURGICAL) ×1 IMPLANT
EXTRACTOR VACUUM M CUP 4 TUBE (SUCTIONS) IMPLANT
GAUZE PAD ABD 7.5X8 STRL (GAUZE/BANDAGES/DRESSINGS) IMPLANT
GAUZE SPONGE 4X4 12PLY STRL LF (GAUZE/BANDAGES/DRESSINGS) IMPLANT
GLOVE BIOGEL PI IND STRL 7.0 (GLOVE) ×1 IMPLANT
GLOVE SURG ORTHO 8.0 STRL STRW (GLOVE) ×1 IMPLANT
GOWN STRL REUS W/TWL LRG LVL3 (GOWN DISPOSABLE) ×2 IMPLANT
KIT ABG SYR 3ML LUER SLIP (SYRINGE) ×1 IMPLANT
NDL HYPO 25X5/8 SAFETYGLIDE (NEEDLE) ×1 IMPLANT
NEEDLE HYPO 25X5/8 SAFETYGLIDE (NEEDLE) ×1 IMPLANT
NS IRRIG 1000ML POUR BTL (IV SOLUTION) ×1 IMPLANT
PACK C SECTION WH (CUSTOM PROCEDURE TRAY) ×1 IMPLANT
PAD OB MATERNITY 4.3X12.25 (PERSONAL CARE ITEMS) ×1 IMPLANT
STRIP CLOSURE SKIN 1/2X4 (GAUZE/BANDAGES/DRESSINGS) IMPLANT
SUT MNCRL 0 VIOLET CTX 36 (SUTURE) ×3 IMPLANT
SUT MON AB 4-0 PS1 27 (SUTURE) ×1 IMPLANT
SUT PDS AB 0 CTX 60 (SUTURE) IMPLANT
SUT VIC AB 1 CTX36XBRD ANBCTRL (SUTURE) IMPLANT
TAPE CLOTH SURG 4X10 WHT LF (GAUZE/BANDAGES/DRESSINGS) IMPLANT
TOWEL OR 17X24 6PK STRL BLUE (TOWEL DISPOSABLE) ×1 IMPLANT
TRAY FOLEY W/BAG SLVR 14FR LF (SET/KITS/TRAYS/PACK) ×1 IMPLANT
WATER STERILE IRR 1000ML POUR (IV SOLUTION) ×1 IMPLANT

## 2024-02-08 NOTE — Progress Notes (Signed)
 Pt called nurse for pain medication 7/10 incisional pain. Per current EMR, no medication can be given yet. This RN called OB Anesthesia Vertell Row who ok'd to give the pt 5mg  PO oxycodone  and call back if something else is needed. RN administered medication and gave pt crackers per request.

## 2024-02-08 NOTE — Lactation Note (Signed)
 This note was copied from a baby's chart. Lactation Consultation Note  Patient Name: Joyce Baker Unijb'd Date: 02/08/2024 Age:28 hours, P2 , 1st time breast feeding  Reason for consult: Initial assessment;1st time breastfeeding;Early term 37-38.6wks;Infant weight loss (1 %) Per mom the baby has breast fed x 3  since birth and was able to sustain a latch 10 - 20 mins.  LC reviewed the 24 hours breast feeding goals - feed with cues and by 3 hours STS . LC reviewed sleep/ wake stages related to feedings.  Mom aware to call for latch assessment.   Maternal Data Does the patient have breastfeeding experience prior to this delivery?: No  Feeding Mother's Current Feeding Choice: Breast Milk  LATCH Score - 7    Lactation Tools Discussed/Used  Mom requesting a hand pump and LC will flange check and instruct on the hand pump with consult.   Interventions Interventions: Breast feeding basics reviewed;Education;CDC milk storage guidelines;CDC Guidelines for Breast Pump Cleaning  Discharge Pump: Hands Free;Personal (per mom hands free Mom Cozy) WIC Program: No  Consult Status Consult Status: Follow-up Date: 02/08/24 Follow-up type: In-patient    Rollene Caldron Terin Cragle 02/08/2024, 12:44 PM

## 2024-02-08 NOTE — Addendum Note (Signed)
 Addendum  created 02/08/24 1459 by Purnell Louana KIDD, CRNA   Flowsheet accepted, Intraprocedure Flowsheets edited

## 2024-02-08 NOTE — MAU Provider Note (Signed)
 Chief Complaint:  Hypertension   HPI     Joyce Baker is a 28 y.o. G2P1001 at [redacted]w[redacted]d who presents to maternity admissions reporting that she was in the office on 12 4 and had elevated blood pressures.  Patient states they were 140s over 90s and this evening around 7 PM she started complaining of a headache and nausea and also elevated pressures in the 140s over 90s her initial blood pressure here was 123/95.  Patient is rating her headache pain 5 out of 10 stating it is constant and throbbing.  Patient states she was initially scheduled for repeat C-section on 02/11/2024 and declines TOLAC.  Her n.p.o. status was patient last ate at 7 PM on 02/07/2024.  Pregnancy Course: [facility]  Past Medical History:  Diagnosis Date   ADHD (attention deficit hyperactivity disorder)    Anxiety    Depression    Gastroesophageal reflux    History of pre-eclampsia in prior pregnancy, currently pregnant    Oppositional defiant disorder    OB History  Gravida Para Term Preterm AB Living  2 1 1   1   SAB IAB Ectopic Multiple Live Births      1    # Outcome Date GA Lbr Len/2nd Weight Sex Type Anes PTL Lv  2 Current           1 Term  [redacted]w[redacted]d    CS-LTranv   LIV   Past Surgical History:  Procedure Laterality Date   CESAREAN SECTION     TONSILLECTOMY     TONSILLECTOMY AND ADENOIDECTOMY     Family History  Problem Relation Age of Onset   Bipolar disorder Mother    Drug abuse Mother    Lupus Mother    Fibromyalgia Mother    Cholelithiasis Mother    Alcohol abuse Father    ADD / ADHD Sister    Depression Sister    Thyroid  disease Neg Hx    Celiac disease Neg Hx    Ulcers Neg Hx    Cholelithiasis Maternal Grandmother    Cholelithiasis Maternal Grandfather    Social History   Tobacco Use   Smoking status: Never   Smokeless tobacco: Never  Vaping Use   Vaping status: Never Used  Substance Use Topics   Alcohol use: No   Drug use: No    Comment: denies current use   Allergies   Allergen Reactions   Amoxicillin Rash   Medications Prior to Admission  Medication Sig Dispense Refill Last Dose/Taking   Prenatal Vit-Fe Fumarate-FA (PRENATAL MULTIVITAMIN) TABS tablet Take 1 tablet by mouth daily at 12 noon.      sertraline  (ZOLOFT ) 25 MG tablet Take 25 mg by mouth daily.      valACYclovir  (VALTREX ) 500 MG tablet Take 500 mg by mouth daily.       I have reviewed patient's Past Medical Hx, Surgical Hx, Family Hx, Social Hx, medications and allergies.   ROS  Pertinent items noted in HPI and remainder of comprehensive ROS otherwise negative.   PHYSICAL EXAM  Patient Vitals for the past 24 hrs:  BP Temp Temp src Pulse Resp SpO2 Height Weight  02/08/24 0033 (!) 123/95 -- -- 93 -- -- -- --  02/08/24 0031 -- 98.1 F (36.7 C) Oral -- 20 99 % 5' 2 (1.575 m) 86.6 kg    Constitutional: Well-developed, obese female in no acute distress.  Cardiovascular: normal rate & rhythm, warm and well-perfused Respiratory: normal effort, no problems with respiration noted  GI: Abd soft, non-tender, gravid, no ruq pain illicited MS: Extremities nontender, no edema, normal ROM Neurologic: Alert and oriented x 4.      Fetal Tracing: NST Reactive @ 0119 Baseline:140 Variability:moderate  Accelerations: present Decelerations:absent Toco: Quite   Labs: No results found for this or any previous visit (from the past 24 hours).  Imaging:  No results found.  MDM & MAU COURSE  MDM:  HIGH  Admit -> LD/OR For repeat C- Section  Patient meets criteria for gHTN based on 2 elevated BP's >140/90 more than 4 hours apart and now with new onset HA. Recommendations are for Delivery at [redacted]w[redacted]d as DW DR Constant ( OB MAU  Attending) Will notify Dr Marget   Plan for Pre Op  OR Notified by Dr Marget CBC CMP PC Ratio UA  BP monitoring IV Lock placed NPO status  Reglan  IVP for HA  MAU Course: Orders Placed This Encounter  Procedures   CBC   Comprehensive metabolic panel   Protein /  creatinine ratio, urine   Urinalysis, Routine w reflex microscopic -Urine, Random   RPR   Type and screen [facility]   Insert peripheral IV   Admit to Inpatient (patient's expected length of stay will be greater than 2 midnights or inpatient only procedure)   Meds ordered this encounter  Medications   metoCLOPramide  (REGLAN ) injection 10 mg    I have reviewed the patient chart and performed the physical exam . I have ordered & interpreted the lab results and reviewed and interpreted the NST Medications ordered as stated below.  A/P as described below.  Counseling and education provided and patient agreeable  with plan as described below. Verbalized understanding.    ASSESSMENT   1. Gestational hypertension, third trimester   2. [redacted] weeks gestation of pregnancy   3. H/O: C-section     PLAN  Admit -> LD/OR For repeat C- Section PreOp orders to follow by Dr Marget  Routine admission labs ordered  --------------------------------------------------------------------------------------------- Olam Dalton, MSN, [facility], Center for [facility]    This chart was dictated using voice recognition software, Dragon. Despite the best efforts of this provider to proofread and correct errors, errors may still occur which can change documentation meaning.

## 2024-02-08 NOTE — H&P (Signed)
 Joyce Baker is a 28 y.o. female presenting for elevated BPs in office.  Hx of PIH last preg.  On arrival, BPs in mild GHTN range, no severe sxs and normal labs.  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  GBS negative. OB History     Gravida  2   Para  1   Term  1   Preterm      AB      Living  1      SAB      IAB      Ectopic      Multiple      Live Births  1          Past Medical History:  Diagnosis Date   ADHD (attention deficit hyperactivity disorder)    Anxiety    Depression    Gastroesophageal reflux    History of pre-eclampsia in prior pregnancy, currently pregnant    Oppositional defiant disorder    Past Surgical History:  Procedure Laterality Date   CESAREAN SECTION     TONSILLECTOMY     TONSILLECTOMY AND ADENOIDECTOMY     Family History: family history includes ADD / ADHD in her sister; Alcohol abuse in her father; Bipolar disorder in her mother; Cholelithiasis in her maternal grandfather, maternal grandmother, and mother; Depression in her sister; Drug abuse in her mother; Fibromyalgia in her mother; Lupus in her mother. Social History:  reports that she has never smoked. She has never used smokeless tobacco. She reports that she does not drink alcohol and does not use drugs.     Maternal Diabetes: No Genetic Screening: Normal Maternal Ultrasounds/Referrals: Normal Fetal Ultrasounds or other Referrals:  None Maternal Substance Abuse:  No Significant Maternal Medications:   Significant Maternal Lab Results:  Group B Strep negative, Rubella susceptible Number of Prenatal Visits:greater than 3 verified prenatal visits Maternal Vaccinations:TDap Other Comments:  None  Review of Systems History   Blood pressure (!) 139/100, pulse 81, temperature 98.6 F (37 C), temperature source Oral, resp. rate 17, height 5' 2 (1.575 m), weight 86.6 kg, SpO2 99%, unknown if currently  breastfeeding. Exam Physical Exam  Vitals and nursing note reviewed. Exam conducted with a chaperone present.  Constitutional:      Appearance: Normal appearance.  HENT:     Head: Normocephalic.  Eyes:     Pupils: Pupils are equal, round, and reactive to light.  Cardiovascular:     Rate and Rhythm: Normal rate and regular rhythm.     Pulses: Normal pulses.  Abdominal:     General: Abdomen is Gravid, nontender Neurological:     Mental Status: She is alert. Pt wishes Full resuscitation in the event of a code. 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  All questions were answered and informed consent was obtained.  Plan to proceed with low segment transverse Cesarean Section.   Alm JAYSON Cook 02/08/2024, 6:14 AM

## 2024-02-08 NOTE — Anesthesia Preprocedure Evaluation (Addendum)
 Anesthesia Evaluation  Patient identified by MRN, date of birth, ID band Patient awake    Reviewed: Allergy & Precautions, NPO status , Patient's Chart, lab work & pertinent test results  Airway Mallampati: II  TM Distance: >3 FB Neck ROM: Full    Dental no notable dental hx.    Pulmonary neg pulmonary ROS   Pulmonary exam normal breath sounds clear to auscultation       Cardiovascular hypertension (gHTN), Normal cardiovascular exam Rhythm:Regular Rate:Normal     Neuro/Psych  PSYCHIATRIC DISORDERS Anxiety Depression    negative neurological ROS     GI/Hepatic Neg liver ROS,GERD  ,,  Endo/Other  BMI 35  Renal/GU negative Renal ROS  negative genitourinary   Musculoskeletal negative musculoskeletal ROS (+)    Abdominal   Peds negative pediatric ROS (+)  Hematology negative hematology ROS (+) Hb 12.2, plt 253   Anesthesia Other Findings   Reproductive/Obstetrics (+) Pregnancy Prior section                              Anesthesia Physical Anesthesia Plan  ASA: 3  Anesthesia Plan: Spinal   Post-op Pain Management: Regional block, Ofirmev  IV (intra-op)* and Toradol  IV (intra-op)*   Induction:   PONV Risk Score and Plan: 3 and Ondansetron , Dexamethasone  and Treatment may vary due to age or medical condition  Airway Management Planned: Natural Airway and Nasal Cannula  Additional Equipment: None  Intra-op Plan:   Post-operative Plan:   Informed Consent: I have reviewed the patients History and Physical, chart, labs and discussed the procedure including the risks, benefits and alternatives for the proposed anesthesia with the patient or authorized representative who has indicated his/her understanding and acceptance.       Plan Discussed with: CRNA  Anesthesia Plan Comments:          Anesthesia Quick Evaluation

## 2024-02-08 NOTE — Progress Notes (Signed)
 Pt called out reporting that she was bleeding through her pad. RN went into room to assess pt. Fundal assessment firm, midline, U/1, scant bleeding. Peri care performed and pt repositioned in the bed. Encouraged pt to always call out if she is concerned about her bleeding.

## 2024-02-08 NOTE — Anesthesia Procedure Notes (Addendum)
 Spinal  Patient location during procedure: OR Start time: 02/08/2024 6:46 AM End time: 02/08/2024 6:49 AM Reason for block: surgical anesthesia Staffing Performed: anesthesiologist  Anesthesiologist: Paul Lamarr BRAVO, MD Performed by: Paul Lamarr BRAVO, MD Authorized by: Paul Lamarr BRAVO, MD   Preanesthetic Checklist Completed: patient identified, IV checked, risks and benefits discussed, monitors and equipment checked, pre-op evaluation and timeout performed Spinal Block Patient position: sitting Prep: DuraPrep and site prepped and draped Patient monitoring: heart rate, continuous pulse ox and blood pressure Approach: midline Location: L3-4 Injection technique: single-shot Needle Needle type: Pencan  Needle gauge: 24 G Needle length: 10 cm Assessment Sensory level: T4 Events: CSF return Additional Notes Risks, benefits, and alternative discussed. Patient gave consent to procedure. Prepped and draped in sitting position. Clear CSF obtained after one needle pass. Positive terminal aspiration. No pain or paraesthesias with injection. Patient tolerated procedure well. Vital signs stable. LANEY Paul, MD

## 2024-02-08 NOTE — Lactation Note (Signed)
 This note was copied from a baby's chart. Lactation Consultation Note  Patient Name: Joyce Baker Unijb'd Date: 02/08/2024 Age:28 hours, P2  Reason for consult: Follow-up assessment;1st time breastfeeding;Early term 37-38.6wks During this consult, LC started hand pump and checked the flange size - #18 F was good fit and per mom comfortable.  Per mom prior to the [facility] entering the room at 1400 the baby fed 15 mins per mom.  Baby was being  changed by mom , and still awake. LC offered to assist and mom receptive. See doc flow sheets for details.   Maternal Data Has patient been taught Hand Expression?: Yes Does the patient have breastfeeding experience prior to this delivery?: No  Feeding Mother's Current Feeding Choice: Breast Milk  LATCH Score Latch: Repeated attempts needed to sustain latch, nipple held in mouth throughout feeding, stimulation needed to elicit sucking reflex.  Audible Swallowing: A few with stimulation  Type of Nipple: Everted at rest and after stimulation (areola edema , reverse pressure helped)  Comfort (Breast/Nipple): Soft / non-tender  Hold (Positioning): Assistance needed to correctly position infant at breast and maintain latch.  LATCH Score: 7   Lactation Tools Discussed/Used Tools: Pump;Flanges Flange Size: 18 (LC checked flange) Breast pump type: Manual Pump Education: Milk Storage;Setup, frequency, and cleaning  Interventions Interventions: Breast feeding basics reviewed;Assisted with latch;Skin to skin;Breast massage;Hand express;Pre-pump if needed;Reverse pressure;Breast compression;Adjust position;Support pillows;Position options;Hand pump;Education;LC Services brochure;CDC milk storage guidelines;CDC Guidelines for Breast Pump Cleaning  Discharge Pump: Personal;Hands Free;Manual WIC Program: No  Consult Status Consult Status: Follow-up Date: 02/09/24 Follow-up type: In-patient    Rollene Caldron [name] 02/08/2024, 4:09 PM

## 2024-02-08 NOTE — Transfer of Care (Signed)
 Immediate Anesthesia Transfer of Care Note  Patient: Joyce Baker  Procedure(s) Performed: CESAREAN DELIVERY  Patient Location: PACU  Anesthesia Type:Spinal  Level of Consciousness: awake, alert , and oriented  Airway & Oxygen Therapy: Patient Spontanous Breathing  Post-op Assessment: Report given to RN and Post -op Vital signs reviewed and stable  Post vital signs: Reviewed and stable  Last Vitals:  Vitals Value Taken Time  BP 94/81 02/08/24 08:15  Temp    Pulse 79 02/08/24 08:17  Resp 17 02/08/24 08:17  SpO2 96 % 02/08/24 08:17  Vitals shown include unfiled device data.  Last Pain:  Vitals:   02/08/24 0547  TempSrc: Oral  PainSc:          Complications: No notable events documented.

## 2024-02-08 NOTE — MAU Note (Signed)
 Pt stated she was in the off 12/4 and had some elevated BPs. Took her Bps at home and BPs were in the 140s over 90s. C/o of headache and nausea. Has Pre E with Last pregnancy.

## 2024-02-08 NOTE — Anesthesia Postprocedure Evaluation (Signed)
 Anesthesia Post Note  Patient: Joyce Baker  Procedure(s) Performed: CESAREAN DELIVERY     Patient location during evaluation: PACU Anesthesia Type: Spinal Level of consciousness: awake and alert Pain management: pain level controlled Vital Signs Assessment: post-procedure vital signs reviewed and stable Respiratory status: spontaneous breathing, nonlabored ventilation and respiratory function stable Cardiovascular status: blood pressure returned to baseline Postop Assessment: no apparent nausea or vomiting, spinal receding, no headache and no backache Anesthetic complications: no   No notable events documented.  Last Vitals:  Vitals:   02/08/24 0930 02/08/24 0931  BP: 132/81 111/79  Pulse: 64   Resp: 16   Temp: 36.7 C   SpO2: 100%     Last Pain:  Vitals:   02/08/24 0930  TempSrc: Oral  PainSc:    Pain Goal:    LLE Motor Response: Purposeful movement (02/08/24 0900) LLE Sensation: Tingling (02/08/24 0900) RLE Motor Response: Purposeful movement (02/08/24 0900) RLE Sensation: Tingling (02/08/24 0900)     Epidural/Spinal Function Cutaneous sensation: Tingles (02/08/24 0900), Patient able to flex knees: No (02/08/24 0900), Patient able to lift hips off bed: No (02/08/24 0900), Back pain beyond tenderness at insertion site: No (02/08/24 0900), Progressively worsening motor and/or sensory loss: No (02/08/24 0900), Bowel and/or bladder incontinence post epidural: No (02/08/24 0900)  Vertell Row

## 2024-02-08 NOTE — Op Note (Signed)
 Cesarean Section Procedure Note  Pre-operative Diagnosis: IUP at 38 3/7, Previous c/s for repeat, GHTN  Post-operative Diagnosis: same  Surgeon: MARGET ALM BROCKS   Assistants: Bari, Surg tech  Anesthesia: spinal  Procedure:  Low Segment Transverse cesarean section  Procedure Details  The patient was seen in the Holding Room. The risks, benefits, complications, treatment options, and expected outcomes were discussed with the patient.  The patient concurred with the proposed plan, giving informed consent.  The site of surgery properly noted/marked.. A Time Out was held and the above information confirmed.  After induction of anesthesia, the patient was draped and prepped in the usual sterile manner. A Pfannenstiel incision was made and carried down through the subcutaneous tissue to the fascia. Fascial incision was made and extended transversely. The fascia was separated from the underlying rectus tissue superiorly and inferiorly. The peritoneum was identified and entered. Peritoneal incision was extended longitudinally. The utero-vesical peritoneal reflection was incised transversely and the bladder flap was bluntly freed from the lower uterine segment. A low transverse uterine incision was made. Delivered from vertex presentation with VE as a baby with Apgar scores of  female.  After the umbilical cord was clamped and cut cord blood was obtained for evaluation. The placenta was removed intact and appeared normal. The uterine outline, tubes and ovaries appeared normal. The uterine incision was closed with running locked sutures of 0 monocryl and imbricated with 0 monocryl. Hemostasis was observed. Lavage was carried out until clear. The peritoneum was then closed with 0 monocryl and rectus muscles plicated in the midline.  After hemostasis was assured, the fascia was then reapproximated with running sutures of 0 Vicryl. Irrigation was applied and after adequate hemostasis was assured, the skin was  reapproximated with subcutaneous sutures using 4-0 monocryl.  Instrument, sponge, and needle counts were correct prior the abdominal closure and at the conclusion of the case. The patient received 2 grams cefotetan preoperatively.  Findings: Viable  female  Estimated Blood Loss:  215 cc         Specimens: Placenta was sent to labor and delivery         Complications:  None

## 2024-02-09 ENCOUNTER — Encounter (HOSPITAL_COMMUNITY): Payer: Self-pay | Admitting: Obstetrics and Gynecology

## 2024-02-09 LAB — CBC
HCT: 25.9 % — ABNORMAL LOW (ref 36.0–46.0)
Hemoglobin: 9.3 g/dL — ABNORMAL LOW (ref 12.0–15.0)
MCH: 35.8 pg — ABNORMAL HIGH (ref 26.0–34.0)
MCHC: 35.9 g/dL (ref 30.0–36.0)
MCV: 99.6 fL (ref 80.0–100.0)
Platelets: 191 K/uL (ref 150–400)
RBC: 2.6 MIL/uL — ABNORMAL LOW (ref 3.87–5.11)
RDW: 13 % (ref 11.5–15.5)
WBC: 11.1 K/uL — ABNORMAL HIGH (ref 4.0–10.5)
nRBC: 0 % (ref 0.0–0.2)

## 2024-02-09 MED ORDER — ONDANSETRON 4 MG PO TBDP
8.0000 mg | ORAL_TABLET | Freq: Four times a day (QID) | ORAL | Status: DC | PRN
  Administered 2024-02-10 (×2): 8 mg via ORAL
  Filled 2024-02-09 (×2): qty 2

## 2024-02-09 NOTE — Progress Notes (Signed)
 POD # 1  Doing well BP 107/70   Pulse 68   Temp 98.2 F (36.8 C)   Resp 18   Ht 5' 2 (1.575 m)   Wt 86.6 kg   SpO2 96%   Breastfeeding Unknown   BMI 34.93 kg/m  No results found for this or any previous visit (from the past 24 hours). Abdomen is soft and non tender Bandage clean and dry   POD # 1  Doing well Routine care  Circ today Discharge home tomorrow

## 2024-02-09 NOTE — Progress Notes (Signed)
 MOB was referred for hx of Anxiety, Depression, and ADHD.  * Referral screened out by Clinical Social Worker because none of the following criteria appear to apply: ~ History of anxiety/depression during this pregnancy, or of post-partum depression following prior delivery. ~ Diagnosis of anxiety and/or depression within last 3 years OR * MOB's symptoms currently being treated with medication and/or therapy. MOB is prescribed Zoloft 25mg  daily and PRN Vistaril. Edinburgh score = 9.  Please contact the Clinical Social Worker if needs arise, by [facility] request, or if MOB scores greater than 9/yes to question 10 on Edinburgh Postpartum Depression Screen.  Signed,  Sharyne LOIS Roulette, MSW, LCSWA, LCASA 29-Jun-2023 5:40 PM

## 2024-02-09 NOTE — Plan of Care (Signed)
  Problem: Education: Goal: Knowledge of disease or condition will improve Outcome: Progressing Goal: Knowledge of the prescribed therapeutic regimen will improve Outcome: Progressing   Problem: Fluid Volume: Goal: Peripheral tissue perfusion will improve Outcome: Progressing   Problem: Clinical Measurements: Goal: Complications related to disease process, condition or treatment will be avoided or minimized Outcome: Progressing   Problem: Education: Goal: Knowledge of General Education information will improve Description: Including pain rating scale, medication(s)/side effects and non-pharmacologic comfort measures Outcome: Progressing   Problem: Health Behavior/Discharge Planning: Goal: Ability to manage health-related needs will improve Outcome: Progressing   Problem: Clinical Measurements: Goal: Ability to maintain clinical measurements within normal limits will improve Outcome: Progressing Goal: Will remain free from infection Outcome: Progressing Goal: Diagnostic test results will improve Outcome: Progressing Goal: Respiratory complications will improve Outcome: Progressing Goal: Cardiovascular complication will be avoided Outcome: Progressing   Problem: Activity: Goal: Risk for activity intolerance will decrease Outcome: Progressing   Problem: Nutrition: Goal: Adequate nutrition will be maintained Outcome: Progressing   Problem: Coping: Goal: Level of anxiety will decrease Outcome: Progressing   Problem: Elimination: Goal: Will not experience complications related to bowel motility Outcome: Progressing Goal: Will not experience complications related to urinary retention Outcome: Progressing   Problem: Pain Managment: Goal: General experience of comfort will improve and/or be controlled Outcome: Progressing   Problem: Safety: Goal: Ability to remain free from injury will improve Outcome: Progressing   Problem: Skin Integrity: Goal: Risk for impaired  skin integrity will decrease Outcome: Progressing   Problem: Education: Goal: Knowledge of the prescribed therapeutic regimen will improve Outcome: Progressing Goal: Understanding of sexual limitations or changes related to disease process or condition will improve Outcome: Progressing Goal: Individualized Educational Video(s) Outcome: Progressing   Problem: Self-Concept: Goal: Communication of feelings regarding changes in body function or appearance will improve Outcome: Progressing   Problem: Skin Integrity: Goal: Demonstration of wound healing without infection will improve Outcome: Progressing   Problem: Education: Goal: Knowledge of condition will improve Outcome: Progressing Goal: Individualized Educational Video(s) Outcome: Progressing Goal: Individualized Newborn Educational Video(s) Outcome: Progressing   Problem: Activity: Goal: Will verbalize the importance of balancing activity with adequate rest periods Outcome: Progressing Goal: Ability to tolerate increased activity will improve Outcome: Progressing   Problem: Coping: Goal: Ability to identify and utilize available resources and services will improve Outcome: Progressing   Problem: Life Cycle: Goal: Chance of risk for complications during the postpartum period will decrease Outcome: Progressing   Problem: Role Relationship: Goal: Ability to demonstrate positive interaction with newborn will improve Outcome: Progressing   Problem: Skin Integrity: Goal: Demonstration of wound healing without infection will improve Outcome: Progressing

## 2024-02-10 MED ORDER — ONDANSETRON 8 MG PO TBDP: 8.0000 mg | ORAL_TABLET | Freq: Four times a day (QID) | ORAL | 0 refills | Status: AC | PRN

## 2024-02-10 MED ORDER — IBUPROFEN 600 MG PO TABS: 600.0000 mg | ORAL_TABLET | Freq: Four times a day (QID) | ORAL | 0 refills | Status: AC | PRN

## 2024-02-10 MED ORDER — OXYCODONE HCL 5 MG PO TABS: 5.0000 mg | ORAL_TABLET | ORAL | 0 refills | Status: AC | PRN

## 2024-02-10 NOTE — Discharge Summary (Signed)
 Postpartum Discharge Summary  Date of Service February 10, 2024     Patient Name: Joyce Baker DOB: 1995/07/19 MRN: [id number]  Date of admission: 02/08/2024 Delivery date:02/08/2024 Delivering provider: MARGET LENIS Date of discharge: 02/10/2024  Admitting diagnosis: Gestational hypertension [O13.9] S/P repeat low transverse C-section [Z98.891] Pregnant [Z34.90] Cesarean delivery delivered [O82] Intrauterine pregnancy: [redacted]w[redacted]d     Secondary diagnosis:  Principal Problem:   Gestational hypertension Active Problems:   S/P repeat low transverse C-section   Pregnant   Cesarean delivery delivered  Additional problems: none     Discharge diagnosis: Term Pregnancy Delivered and Gestational Hypertension                                              Post partum procedures:not applicable Augmentation: N/A Complications: None  Hospital course: Sceduled C/S   28 y.o. yo G2P2002 at [redacted]w[redacted]d was admitted to the hospital 02/08/2024 for scheduled cesarean section with the following indication:Elective Repeat.Delivery details are as follows:  Membrane Rupture Time/Date: 7:20 AM,02/08/2024  Delivery Method:C-Section, Vacuum Assisted Operative Delivery:N/A Details of operation can be found in separate operative note.  Patient had a postpartum course complicated by nothing.  She is ambulating, tolerating a regular diet, passing flatus, and urinating well. Patient is discharged home in stable condition on  02/10/24        Newborn Data: Birth date:02/08/2024 Birth time:7:21 AM Gender:Female Living status:Living Apgars:9 ,9  Weight:3300 g    Magnesium  Sulfate received: No BMZ received: No Rhophylac:N/A MMR:N/A T-DaP:Given prenatally Flu: Yes RSV Vaccine received: No Transfusion:No Immunizations administered: Immunization History  Administered Date(s) Administered   HPV Quadrivalent 12/20/2012   Influenza,Quad,Nasal, Live 12/20/2012   Influenza,inj,Quad PF,6+ Mos 11/28/2017   Tdap  11/15/2015, 10/18/2017    Physical exam  Vitals:   02/09/24 0646 02/09/24 1341 02/09/24 2121 02/10/24 0556  BP: 107/70 110/83 113/72 113/85  Pulse: 68 88 76 72  Resp: 18 18 18 18   Temp: 98.2 F (36.8 C) 98.3 F (36.8 C) 98.2 F (36.8 C) 98.7 F (37.1 C)  TempSrc:  Oral Oral Oral  SpO2: 96% 99% 97% 100%  Weight:      Height:       General: alert, cooperative, and no distress Lochia: appropriate Uterine Fundus: firm Incision: Healing well with no significant drainage DVT Evaluation: No evidence of DVT seen on physical exam. Labs: Lab Results  Component Value Date   WBC 11.1 (H) 02/09/2024   HGB 9.3 (L) 02/09/2024   HCT 25.9 (L) 02/09/2024   MCV 99.6 02/09/2024   PLT 191 02/09/2024      Latest Ref Rng & Units 02/08/2024    1:19 AM  CMP  Glucose 70 - 99 mg/dL 81   BUN 6 - 20 mg/dL 14   Creatinine 9.55 - 1.00 mg/dL 9.48   Sodium 864 - 854 mmol/L 136   Potassium 3.5 - 5.1 mmol/L 4.1   Chloride 98 - 111 mmol/L 106   CO2 22 - 32 mmol/L 20   Calcium  8.9 - 10.3 mg/dL 8.8   Total Protein 6.5 - 8.1 g/dL 6.3   Total Bilirubin 0.0 - 1.2 mg/dL 0.7   Alkaline Phos 38 - 126 U/L 148   AST 15 - 41 U/L 23   ALT 0 - 44 U/L 12    Edinburgh Score:    02/09/2024    3:00  PM  Edinburgh Postnatal Depression Scale Screening Tool  I have been able to laugh and see the funny side of things. 0  I have looked forward with enjoyment to things. 0  I have blamed myself unnecessarily when things went wrong. 1  I have been anxious or worried for no good reason. 2  I have felt scared or panicky for no good reason. 2  Things have been getting on top of me. 1  I have been so unhappy that I have had difficulty sleeping. 1  I have felt sad or miserable. 1  I have been so unhappy that I have been crying. 1  The thought of harming myself has occurred to me. 0  Edinburgh Postnatal Depression Scale Total 9      After visit meds:  Allergies as of 02/10/2024       Reactions   Amoxicillin Rash         Medication List     TAKE these medications    ibuprofen  600 MG tablet Commonly known as: ADVIL  Take 1 tablet (600 mg total) by mouth every 6 (six) hours as needed for mild pain (pain score 1-3) or moderate pain (pain score 4-6).   ondansetron  8 MG disintegrating tablet Commonly known as: ZOFRAN -ODT Take 1 tablet (8 mg total) by mouth every 6 (six) hours as needed for nausea or vomiting.   oxyCODONE  5 MG immediate release tablet Commonly known as: Oxy IR/ROXICODONE  Take 1-2 tablets (5-10 mg total) by mouth every 4 (four) hours as needed for severe pain (pain score 7-10).   prenatal multivitamin Tabs tablet Take 1 tablet by mouth daily at 12 noon.   sertraline  25 MG tablet Commonly known as: ZOLOFT  Take 25 mg by mouth daily.   valACYclovir  500 MG tablet Commonly known as: VALTREX  Take 500 mg by mouth daily.         Discharge home in stable condition Infant Feeding: Breast Infant Disposition:home with mother Discharge instruction: per After Visit Summary and Postpartum booklet. Activity: Advance as tolerated. Pelvic rest for 6 weeks.  Diet: routine diet Anticipated Birth Control: Unsure Postpartum Appointment:6 weeks Additional Postpartum F/U: not applicable Future Appointments: Future Appointments  Date Time Provider Department Center  03/28/2024  1:20 PM Tobb, Kardie, DO [facility] None   Follow up Visit:      02/10/2024 Rosaline LITTIE Cobble, MD

## 2024-02-10 NOTE — Progress Notes (Signed)
 Pt c/o nausea; had IV zofran  last at 1444. Pt reports IV site burns when RN flushed with normal saline. Dr. Mat called for oral route. Order received.

## 2024-02-11 ENCOUNTER — Inpatient Hospital Stay (HOSPITAL_COMMUNITY): Admission: RE | Admit: 2024-02-11 | Source: Home / Self Care | Admitting: Obstetrics and Gynecology

## 2024-02-11 ENCOUNTER — Encounter (HOSPITAL_COMMUNITY): Admission: RE | Payer: Self-pay | Source: Home / Self Care

## 2024-02-11 SURGERY — Surgical Case
Anesthesia: Regional

## 2024-02-16 ENCOUNTER — Telehealth (HOSPITAL_COMMUNITY): Payer: Self-pay

## 2024-02-16 NOTE — Telephone Encounter (Signed)
 02/16/2024 0944  Name: Joyce Baker MRN: [id number] DOB: 25-Jul-1995  Reason for Call:  Transition of Care Hospital Discharge Call  Contact Status: Patient Contact Status: Complete  Language assistant needed:          Follow-Up Questions: Do You Have Any Concerns About Your Health As You Heal From Delivery?: Yes What Concerns Do You Have About Your Health?: Patient reports a burning sensation on side of incision and is concerned. Patient states that her dressing is off and the incision looks good. RN told patient that soreness on sides of incision can be from the surgery and manipulation of tissue. RN reviewed signs of infection and incision care. RN told patient to reach out to her provider if she has concerns and they are happy to see her and assess. Patient also reports not having BM in 2 days. She states that she is taking stool softners and miralax . RN reviewed drinking warm fluids, eating a good diet, and walking to encourage bowel motility. RN also told patient to reach out to her provider with constipation concerns. Patient has no other concerns about her health or healing. Do You Have Any Concerns About Your Infants Health?: Yes What Concerns Do You Have About Your Baby?: Patient reports that baby's umbilical cord stump has not fallen off yet. Patient states that cord got pulled on diaper and has some old blood present at cord stump. Patient reports there is no active bleeding present. RN reviewed cord care and told patient if any active bleeding or oozing is present to call her pediatrician. RN also reviewed signs of infection at umbilical cord stump and when to call pediatrician. Patient has no other questions or concerns about baby.  Edinburgh Postnatal Depression Scale:  In the Past 7 Days: I have been able to laugh and see the funny side of things.: As much as I always could I have looked forward with enjoyment to things.: As much as I ever did I have blamed myself  unnecessarily when things went wrong.: No, never I have been anxious or worried for no good reason.: Yes, sometimes I have felt scared or panicky for no good reason.: No, not at all Things have been getting on top of me.: No, most of the time I have coped quite well I have been so unhappy that I have had difficulty sleeping.: Not at all I have felt sad or miserable.: No, not at all I have been so unhappy that I have been crying.: Only occasionally The thought of harming myself has occurred to me.: Never Edinburgh Postnatal Depression Scale Total: 4  PHQ2-9 Depression Scale:     Discharge Follow-up: Edinburgh score requires follow up?: No Patient was advised of the following resources:: Breastfeeding Support Group, Support Group Patient referred to:: OB  Post-discharge interventions: Reviewed Newborn Safe Sleep Practices  Signature  Rosaline Deretha PEAK

## 2024-02-26 ENCOUNTER — Ambulatory Visit: Admitting: Cardiology

## 2024-03-28 ENCOUNTER — Ambulatory Visit: Admitting: Cardiology
